# Patient Record
Sex: Male | Born: 1935 | Race: White | Hispanic: No | Marital: Married | Smoking: Former smoker
Health system: Southern US, Community
[De-identification: ages and names within clinical notes are randomized; demographics above are authoritative.]

## PROBLEM LIST (undated history)

## (undated) DIAGNOSIS — K649 Unspecified hemorrhoids: Secondary | ICD-10-CM

## (undated) DIAGNOSIS — L570 Actinic keratosis: Secondary | ICD-10-CM

## (undated) DIAGNOSIS — C679 Malignant neoplasm of bladder, unspecified: Secondary | ICD-10-CM

## (undated) DIAGNOSIS — I10 Essential (primary) hypertension: Secondary | ICD-10-CM

## (undated) HISTORY — DX: Essential (primary) hypertension: I10

## (undated) HISTORY — DX: Malignant neoplasm of bladder, unspecified: C67.9

## (undated) HISTORY — DX: Actinic keratosis: L57.0

## (undated) HISTORY — PX: APPENDECTOMY: SHX54

## (undated) HISTORY — DX: Unspecified hemorrhoids: K64.9

## (undated) HISTORY — PX: INGUINAL HERNIA REPAIR: SHX194

## (undated) HISTORY — PX: COLONOSCOPY: SHX174

---

## 1998-02-14 ENCOUNTER — Other Ambulatory Visit: Admission: RE | Admit: 1998-02-14 | Discharge: 1998-02-14 | Payer: Self-pay | Admitting: Urology

## 1998-08-07 ENCOUNTER — Ambulatory Visit (HOSPITAL_COMMUNITY): Admission: RE | Admit: 1998-08-07 | Discharge: 1998-08-07 | Payer: Self-pay | Admitting: Urology

## 1999-04-13 ENCOUNTER — Other Ambulatory Visit: Admission: RE | Admit: 1999-04-13 | Discharge: 1999-04-13 | Payer: Self-pay | Admitting: Urology

## 2005-09-09 ENCOUNTER — Ambulatory Visit: Payer: Self-pay | Admitting: Family Medicine

## 2009-11-24 ENCOUNTER — Ambulatory Visit (HOSPITAL_COMMUNITY): Admission: RE | Admit: 2009-11-24 | Discharge: 2009-11-25 | Payer: Self-pay | Admitting: General Surgery

## 2010-11-15 ENCOUNTER — Encounter: Payer: Self-pay | Admitting: General Surgery

## 2011-01-11 LAB — CBC
HCT: 41.7 % (ref 39.0–52.0)
Hemoglobin: 13.5 g/dL (ref 13.0–17.0)
MCV: 91.7 fL (ref 78.0–100.0)
Platelets: 163 10*3/uL (ref 150–400)
RDW: 13.4 % (ref 11.5–15.5)

## 2011-01-11 LAB — DIFFERENTIAL
Basophils Absolute: 0 10*3/uL (ref 0.0–0.1)
Eosinophils Relative: 8 % — ABNORMAL HIGH (ref 0–5)
Lymphocytes Relative: 34 % (ref 12–46)
Lymphs Abs: 1.3 10*3/uL (ref 0.7–4.0)
Neutro Abs: 1.6 10*3/uL — ABNORMAL LOW (ref 1.7–7.7)
Neutrophils Relative %: 43 % (ref 43–77)

## 2011-01-11 LAB — BASIC METABOLIC PANEL
BUN: 14 mg/dL (ref 6–23)
GFR calc Af Amer: >60 mL/min (ref >60)
GFR calc non Af Amer: >60 mL/min (ref >60)
Potassium: 4.1 mEq/L (ref 3.5–5.1)
Sodium: 142 mEq/L (ref 135–145)

## 2017-01-08 ENCOUNTER — Emergency Department
Admission: EM | Admit: 2017-01-08 | Discharge: 2017-01-08 | Disposition: A | Discharge status: Home / Self Care | Payer: Medicare Other | Source: Home / Self Care | Attending: Family Medicine | Admitting: Family Medicine

## 2017-01-08 ENCOUNTER — Encounter: Payer: Self-pay | Admitting: Emergency Medicine

## 2017-01-08 DIAGNOSIS — R197 Diarrhea, unspecified: Secondary | ICD-10-CM

## 2017-01-08 DIAGNOSIS — R112 Nausea with vomiting, unspecified: Secondary | ICD-10-CM | POA: Diagnosis not present

## 2017-01-08 MED ORDER — ONDANSETRON 4 MG PO TBDP: ORAL_TABLET | ORAL | 0 refills | Status: DC

## 2017-01-08 NOTE — ED Provider Notes (Signed)
[facility]    CSN: [id number] Arrival date & time: 01/08/17  1804     History   Chief Complaint Chief Complaint  Patient presents with  . Diarrhea  . Emesis    HPI Phillip Monroe is a 82 y.o. male.   Patient awoke at 3am today with watery diarrhea, and has had about 12 to 15 episodes since then.  He had one episode of nausea/vomiting.  He has had abdominal cramps but not abdominal pain.  No fevers, chills, and sweats. Denies recent foreign travel, or drinking untreated water in a wilderness environment.  He denies recent antibiotic use.    The history is provided by the patient.  Diarrhea  Quality:  Watery Severity:  Moderate Onset quality:  Sudden Number of episodes:  12 to 15 Duration:  15 hours Timing:  Sporadic Progression:  Improving Relieved by:  Nothing Exacerbated by: eating. Ineffective treatments: BRAT diet. Associated symptoms: vomiting   Associated symptoms: no abdominal pain, no arthralgias, no chills, no recent cough, no diaphoresis, no fever, no headaches, no myalgias and no URI   Risk factors: no recent antibiotic use, no sick contacts, no suspicious food intake and no travel to endemic areas   Emesis  Associated symptoms: diarrhea   Associated symptoms: no abdominal pain, no arthralgias, no chills, no fever, no headaches, no myalgias and no URI     Past Medical History:  Diagnosis Date  . Actinic keratoses   . Bladder cancer ([place])   . Hemorrhoids   . Hypertension     There are no active problems to display for this patient.   Past Surgical History:  Procedure Laterality Date  . APPENDECTOMY    . COLONOSCOPY    . INGUINAL HERNIA REPAIR         Home Medications    Prior to Admission medications   Medication Sig Start Date End Date Taking? Authorizing Provider  aspirin 81 MG tablet Take 81 mg by mouth daily.    Historical Provider, MD  atenolol (TENORMIN) 100 MG tablet Take 100 mg by mouth daily.    Historical Provider, MD   Fish Oil-Cholecalciferol (FISH OIL + D3) 1000-1000 MG-UNIT CAPS Take by mouth.    Historical Provider, MD  hydrocortisone (ANUSOL-HC) 2.5 % rectal cream Place 1 application rectally 2 (two) times daily.    Historical Provider, MD  omeprazole (PRILOSEC) 20 MG capsule Take 20 mg by mouth daily.    Historical Provider, MD  ondansetron (ZOFRAN ODT) 4 MG disintegrating tablet Take one tab by mouth Q6hr prn nausea.  Dissolve under tongue. 01/08/17   Kandra Nicolas, MD  vitamin E 100 UNIT capsule Take by mouth daily.    Historical Provider, MD    Family History Family History  Problem Relation Age of Onset  . Stroke Mother   . Heart attack Father     Social History Social History  Substance Use Topics  . Smoking status: Never Smoker  . Smokeless tobacco: Not on file  . Alcohol use Not on file     Allergies   Nsaids   Review of Systems Review of Systems  Constitutional: Negative for chills, diaphoresis and fever.  Gastrointestinal: Positive for diarrhea and vomiting. Negative for abdominal pain.  Musculoskeletal: Negative for arthralgias and myalgias.  Neurological: Negative for headaches.     Physical Exam Triage Vital Signs ED Triage Vitals  Enc Vitals Group     BP 01/08/17 1816 134/80     Pulse Rate 01/08/17  1816 91     Resp 01/08/17 1816 16     Temp 01/08/17 1816 97.9 F (36.6 C)     Temp Source 01/08/17 1816 Oral     SpO2 01/08/17 1816 96 %     Weight 01/08/17 1817 164 lb (74.4 kg)     Height 01/08/17 1817 5\' 7"  (1.702 m)     Head Circumference --      Peak Flow --      Pain Score 01/08/17 1819 0     Pain Loc --      Pain Edu? --      Excl. in [place]? --    No data found.   Updated Vital Signs BP 134/80 (BP Location: Left Arm)   Pulse 91   Temp 97.9 F (36.6 C) (Oral)   Resp 16   Ht 5\' 7"  (1.702 m)   Wt 164 lb (74.4 kg)   SpO2 96%   BMI 25.69 kg/m   Visual Acuity Right Eye Distance:   Left Eye Distance:   Bilateral Distance:    Right Eye Near:     Left Eye Near:    Bilateral Near:     Physical Exam Nursing notes and Vital Signs reviewed. Appearance:  Patient appears stated age, and in no acute distress.    Eyes:  Pupils are equal, round, and reactive to light and accomodation.  Extraocular movement is intact.  Conjunctivae are not inflamed   Pharynx:  Normal; moist mucous membranes  Neck:  Supple.  No adenopathy Lungs:  Clear to auscultation.  Breath sounds are equal.  Moving air well. Heart:  Regular rate and rhythm without murmurs, rubs, or gallops.  Abdomen:  Nontender without masses or hepatosplenomegaly.  Bowel sounds are present.  No CVA or flank tenderness.  Extremities:  No edema.  Skin:  No rash present.     UC Treatments / Results  Labs (all labs ordered are listed, but only abnormal results are displayed) Labs Reviewed - No data to display  EKG  EKG Interpretation None       Radiology No results found.  Procedures Procedures (including critical care time)  Medications Ordered in UC Medications - No data to display   Initial Impression / Assessment and Plan / UC Course  I have reviewed the triage vital signs and the nursing notes.  Pertinent labs & imaging results that were available during my care of the patient were reviewed by me and considered in my medical decision making (see chart for details).    Suspect viral gastroenteritis. Rx for Zofran. Begin clear liquids (Pedialyte while having diarrhea) until improved, then advance to a [employer] (Bananas, Rice, Applesauce, Toast).  Then gradually resume a regular diet when tolerated.  Avoid milk products until well.  To decrease diarrhea, mix one teaspoon Citrucel (methylcellulose) in 2 oz water and drink one to three times daily.  Do not drink extra fluids with this dose and do not drink fluids for one hour afterwards.  When stools become more formed, may take Imodium (loperamide) once or twice daily to decrease stool frequency.  If symptoms become  significantly worse during the night or over the weekend, proceed to the local emergency room.  Followup with Family Doctor if not improved in 4 to 5 days.    Final Clinical Impressions(s) / UC Diagnoses   Final diagnoses:  Nausea vomiting and diarrhea    New Prescriptions New Prescriptions   ONDANSETRON (ZOFRAN ODT) 4 MG DISINTEGRATING TABLET  Take one tab by mouth Q6hr prn nausea.  Dissolve under tongue.     Kandra Nicolas, MD 01/13/17 2259

## 2017-01-08 NOTE — Discharge Instructions (Signed)
Begin clear liquids (Pedialyte while having diarrhea) until improved, then advance to a [employer] (Bananas, Rice, Applesauce, Toast).  Then gradually resume a regular diet when tolerated.  Avoid milk products until well.  To decrease diarrhea, mix one teaspoon Citrucel (methylcellulose) in 2 oz water and drink one to three times daily.  Do not drink extra fluids with this dose and do not drink fluids for one hour afterwards.  When stools become more formed, may take Imodium (loperamide) once or twice daily to decrease stool frequency.  If symptoms become significantly worse during the night or over the weekend, proceed to the local emergency room.

## 2017-01-08 NOTE — ED Triage Notes (Signed)
Patient reports awakening at 3:00am today with diarrhea and has had 12-15-episodes since then; he placed himself on BRAT diet but vomited after first solid food; no vomiting since then.

## 2017-02-18 ENCOUNTER — Emergency Department
Admission: EM | Admit: 2017-02-18 | Discharge: 2017-02-18 | Disposition: A | Discharge status: Home / Self Care | Payer: Medicare Other | Source: Home / Self Care | Attending: Family Medicine | Admitting: Family Medicine

## 2017-02-18 ENCOUNTER — Encounter: Payer: Self-pay | Admitting: *Deleted

## 2017-02-18 DIAGNOSIS — J069 Acute upper respiratory infection, unspecified: Secondary | ICD-10-CM

## 2017-02-18 DIAGNOSIS — B9789 Other viral agents as the cause of diseases classified elsewhere: Secondary | ICD-10-CM | POA: Diagnosis not present

## 2017-02-18 MED ORDER — PREDNISONE 20 MG PO TABS: ORAL_TABLET | ORAL | 0 refills | Status: DC

## 2017-02-18 MED ORDER — AZITHROMYCIN 250 MG PO TABS: ORAL_TABLET | ORAL | 0 refills | Status: DC

## 2017-02-18 MED ORDER — BENZONATATE 200 MG PO CAPS: ORAL_CAPSULE | ORAL | 0 refills | Status: DC

## 2017-02-18 NOTE — ED Triage Notes (Signed)
Patient c/o 1 week of cough and congestion. Wheezing on exhalation at times. Afebrile.

## 2017-02-18 NOTE — Discharge Instructions (Signed)
Take plain guaifenesin (1200mg  extended release tabs such as Mucinex) twice daily, with plenty of water, for cough and congestion.  Get adequate rest.   May use Afrin nasal spray (or generic oxymetazoline) each morning for about 5 days and then discontinue.  Also recommend using saline nasal spray several times daily and saline nasal irrigation (AYR is a common brand).   Try warm salt water gargles for sore throat.  Stop all antihistamines for now, and other non-prescription cough/cold preparations.   Begin Azithromycin if not improving about 5 days or if persistent fever develops

## 2017-02-18 NOTE — ED Provider Notes (Signed)
[name]    CSN: [id number] Arrival date & time: 02/18/17  1148     History   Chief Complaint Chief Complaint  Patient presents with  . Cough    HPI Phillip Monroe is a 82 y.o. male.    Patient complains of onset of nasal congestion about a week ago, followed by a non-productive cough 2 to 3 days later.  No sore throat or fever.  During the past 4 days he has had occasional wheezing but no shortness of breath.   The history is provided by the patient.    Past Medical History:  Diagnosis Date  . Actinic keratoses   . Bladder cancer ([place])   . Hemorrhoids   . Hypertension     There are no active problems to display for this patient.   Past Surgical History:  Procedure Laterality Date  . APPENDECTOMY    . COLONOSCOPY    . INGUINAL HERNIA REPAIR         Home Medications    Prior to Admission medications   Medication Sig Start Date End Date Taking? Authorizing Provider  aspirin 81 MG tablet Take 81 mg by mouth daily.    Historical Provider, MD  atenolol (TENORMIN) 100 MG tablet Take 100 mg by mouth daily.    Historical Provider, MD  azithromycin (ZITHROMAX Z-PAK) 250 MG tablet Take 2 tabs today; then begin one tab once daily for 4 more days. (Rx void after 02/26/17) 02/18/17   Kandra Nicolas, MD  benzonatate (TESSALON) 200 MG capsule Take one cap by mouth at bedtime as needed for cough.  May repeat in 4 to 6 hours 02/18/17   Kandra Nicolas, MD  Fish Oil-Cholecalciferol (FISH OIL + D3) 1000-1000 MG-UNIT CAPS Take by mouth.    Historical Provider, MD  hydrocortisone (ANUSOL-HC) 2.5 % rectal cream Place 1 application rectally 2 (two) times daily.    Historical Provider, MD  omeprazole (PRILOSEC) 20 MG capsule Take 20 mg by mouth daily.    Historical Provider, MD  predniSONE (DELTASONE) 20 MG tablet Take one tab by mouth twice daily for 4 days, then one daily for 3 days. Take with food. 02/18/17   Kandra Nicolas, MD  vitamin E 100 UNIT capsule Take by mouth  daily.    Historical Provider, MD    Family History Family History  Problem Relation Age of Onset  . Stroke Mother   . Heart attack Father     Social History Social History  Substance Use Topics  . Smoking status: Former Research scientist (life sciences)  . Smokeless tobacco: Never Used  . Alcohol use Not on file     Allergies   Nsaids   Review of Systems Review of Systems No sore throat + cough No pleuritic pain + wheezing + nasal congestion + post-nasal drainage No sinus pain/pressure No itchy/red eyes No earache No hemoptysis No SOB No fever/chills No nausea No vomiting No abdominal pain No diarrhea No urinary symptoms No skin rash + fatigue No myalgias No headache Used OTC meds without relief   Physical Exam Triage Vital Signs ED Triage Vitals [02/18/17 1205]  Enc Vitals Group     BP (!) 147/82     Pulse Rate 64     Resp 14     Temp 98.5 F (36.9 C)     Temp Source Oral     SpO2 97 %     Weight 164 lb (74.4 kg)     Height  Head Circumference      Peak Flow      Pain Score 0     Pain Loc      Pain Edu?      Excl. in [place]?    No data found.   Updated Vital Signs BP (!) 147/82 (BP Location: Left Arm)   Pulse 64   Temp 98.5 F (36.9 C) (Oral)   Resp 14   Wt 164 lb (74.4 kg)   SpO2 97%   BMI 25.69 kg/m   Visual Acuity Right Eye Distance:   Left Eye Distance:   Bilateral Distance:    Right Eye Near:   Left Eye Near:    Bilateral Near:     Physical Exam Nursing notes and Vital Signs reviewed. Appearance:  Patient appears stated age, and in no acute distress Eyes:  Pupils are equal, round, and reactive to light and accomodation.  Extraocular movement is intact.  Conjunctivae are not inflamed  Ears:  Canals normal.  Tympanic membranes normal.  Nose:  Mildly congested turbinates.  No sinus tenderness.   Pharynx:  Normal Neck:  Supple.  Tender enlarged posterior/lateral nodes are palpated bilaterally  Lungs:  Faint wheezes heard posteriorly.  Breath  sounds are equal.  Moving air well. Heart:  Regular rate and rhythm without murmurs, rubs, or gallops.  Abdomen:  Nontender without masses or hepatosplenomegaly.  Bowel sounds are present.  No CVA or flank tenderness.  Extremities:  No edema.  Skin:  No rash present.    UC Treatments / Results  Labs (all labs ordered are listed, but only abnormal results are displayed) Labs Reviewed - No data to display  EKG  EKG Interpretation None       Radiology No results found.  Procedures Procedures (including critical care time)  Medications Ordered in UC Medications - No data to display   Initial Impression / Assessment and Plan / UC Course  I have reviewed the triage vital signs and the nursing notes.  Pertinent labs & imaging results that were available during my care of the patient were reviewed by me and considered in my medical decision making (see chart for details).    There is no evidence of bacterial infection today.   Begin prednisone burst/taper.  Prescription written for Benzonatate [facility]) to take at bedtime for night-time cough.  Take plain guaifenesin (1200mg  extended release tabs such as Mucinex) twice daily, with plenty of water, for cough and congestion.  Get adequate rest.   May use Afrin nasal spray (or generic oxymetazoline) each morning for about 5 days and then discontinue.  Also recommend using saline nasal spray several times daily and saline nasal irrigation (AYR is a common brand).   Try warm salt water gargles for sore throat.  Stop all antihistamines for now, and other non-prescription cough/cold preparations.   Begin Azithromycin if not improving about 5 days or if persistent fever develops (Given a prescription to hold, with an expiration date)     Final Clinical Impressions(s) / UC Diagnoses   Final diagnoses:  Viral URI with cough    New Prescriptions New Prescriptions   AZITHROMYCIN (ZITHROMAX Z-PAK) 250 MG TABLET    Take 2 tabs today;  then begin one tab once daily for 4 more days. (Rx void after 02/26/17)   BENZONATATE (TESSALON) 200 MG CAPSULE    Take one cap by mouth at bedtime as needed for cough.  May repeat in 4 to 6 hours   PREDNISONE (DELTASONE) 20 MG  TABLET    Take one tab by mouth twice daily for 4 days, then one daily for 3 days. Take with food.     Kandra Nicolas, MD 02/27/17 1005

## 2017-11-18 ENCOUNTER — Other Ambulatory Visit: Payer: Self-pay

## 2017-11-18 ENCOUNTER — Encounter: Payer: Self-pay | Admitting: *Deleted

## 2017-11-18 ENCOUNTER — Emergency Department
Admission: EM | Admit: 2017-11-18 | Discharge: 2017-11-18 | Disposition: A | Discharge status: Home / Self Care | Payer: Medicare Other | Source: Home / Self Care | Attending: Family Medicine | Admitting: Family Medicine

## 2017-11-18 DIAGNOSIS — J069 Acute upper respiratory infection, unspecified: Secondary | ICD-10-CM | POA: Diagnosis not present

## 2017-11-18 NOTE — ED Triage Notes (Signed)
Pt c/o fever up to 102.5, chills, runny nose, and  nonproductive cough x 2 days. He has taken Mucinex plain and DM.

## 2017-11-18 NOTE — ED Provider Notes (Signed)
[name]    CSN: [id number] Arrival date & time: 11/18/17  1617     History   Chief Complaint Chief Complaint  Patient presents with  . Fever    HPI Phillip Monroe is a 83 y.o. male.   HPI  Phillip Monroe is a 83 y.o. male presenting to UC with c/o 2 days of non-productive cough, chills, rhinorrhea and fever Tmax 102.5*F that started the first day of symptoms.  No fever since then.  Pt has been taking Mucinex and mucinex DM with moderate relief. Pt states he has felt a little better each day.  Denies n/v/d. His wife is also in UC to be seen for similar symptoms that started yesterday.  He did not get the flu vaccine but has had the flu in the past and states this does not feel like the flu because he felt much worse then. He denies body aches or HA.   Past Medical History:  Diagnosis Date  . Actinic keratoses   . Bladder cancer ([place])   . Hemorrhoids   . Hypertension     There are no active problems to display for this patient.   Past Surgical History:  Procedure Laterality Date  . APPENDECTOMY    . COLONOSCOPY    . INGUINAL HERNIA REPAIR         Home Medications    Prior to Admission medications   Medication Sig Start Date End Date Taking? Authorizing Provider  aspirin 81 MG tablet Take 81 mg by mouth daily.    [provider]  atenolol (TENORMIN) 100 MG tablet Take 100 mg by mouth daily.    [provider]  Fish Oil-Cholecalciferol (FISH OIL + D3) 1000-1000 MG-UNIT CAPS Take by mouth.    [provider]  hydrocortisone (ANUSOL-HC) 2.5 % rectal cream Place 1 application rectally 2 (two) times daily.    [provider]  omeprazole (PRILOSEC) 20 MG capsule Take 20 mg by mouth daily.    [provider]  vitamin E 100 UNIT capsule Take by mouth daily.    [provider]    Family History Family History  Problem Relation Age of Onset  . Stroke Mother   . Heart attack Father     Social  History Social History   Tobacco Use  . Smoking status: Former Research scientist (life sciences)  . Smokeless tobacco: Never Used  Substance Use Topics  . Alcohol use: No    Frequency: Never  . Drug use: No     Allergies   Nsaids   Review of Systems Review of Systems  Constitutional: Positive for chills and fever.  HENT: Positive for congestion and rhinorrhea. Negative for ear pain, sore throat, trouble swallowing and voice change.   Respiratory: Positive for cough. Negative for shortness of breath.   Cardiovascular: Negative for chest pain and palpitations.  Gastrointestinal: Negative for abdominal pain, diarrhea, nausea and vomiting.  Musculoskeletal: Negative for arthralgias, back pain and myalgias.  Skin: Negative for rash.  Neurological: Negative for dizziness, light-headedness and headaches.     Physical Exam Triage Vital Signs ED Triage Vitals [11/18/17 1645]  Enc Vitals Group     BP (!) 150/76     Pulse Rate (!) 54     Resp 16     Temp 98 F (36.7 C)     Temp Source Oral     SpO2 98 %     Weight 163 lb (73.9 kg)     Height 5'  5" (1.651 m)     Head Circumference      Peak Flow      Pain Score 0     Pain Loc      Pain Edu?      Excl. in [place]?    No data found.  Updated Vital Signs BP (!) 150/76 (BP Location: Right Arm)   Pulse (!) 54   Temp 98 F (36.7 C) (Oral)   Resp 16   Ht 5\' 5"  (1.651 m)   Wt 163 lb (73.9 kg)   SpO2 98%   BMI 27.12 kg/m   Visual Acuity Right Eye Distance:   Left Eye Distance:   Bilateral Distance:    Right Eye Near:   Left Eye Near:    Bilateral Near:     Physical Exam  Constitutional: He is oriented to person, place, and time. He appears well-developed and well-nourished. No distress.  Pt sitting on exam bed, appears well, NAD  HENT:  Head: Normocephalic and atraumatic.  Right Ear: Tympanic membrane normal.  Left Ear: Tympanic membrane normal.  Nose: Nose normal. Right sinus exhibits no maxillary sinus tenderness and no frontal sinus  tenderness. Left sinus exhibits no maxillary sinus tenderness and no frontal sinus tenderness.  Mouth/Throat: Uvula is midline, oropharynx is clear and moist and mucous membranes are normal.  Eyes: EOM are normal.  Neck: Normal range of motion. Neck supple.  Cardiovascular: Regular rhythm. Bradycardia present.  Slight bradycardia. Regular rhythm  Pulmonary/Chest: Effort normal and breath sounds normal. No stridor. No respiratory distress. He has no wheezes. He has no rales.  Musculoskeletal: Normal range of motion.  Lymphadenopathy:    He has no cervical adenopathy.  Neurological: He is alert and oriented to person, place, and time.  Skin: Skin is warm and dry. He is not diaphoretic.  Psychiatric: He has a normal mood and affect. His behavior is normal.  Nursing note and vitals reviewed.    UC Treatments / Results  Labs (all labs ordered are listed, but only abnormal results are displayed) Labs Reviewed - No data to display  EKG  EKG Interpretation None       Radiology No results found.  Procedures Procedures (including critical care time)  Medications Ordered in UC Medications - No data to display   Initial Impression / Assessment and Plan / UC Course  I have reviewed the triage vital signs and the nursing notes.  Pertinent labs & imaging results that were available during my care of the patient were reviewed by me and considered in my medical decision making (see chart for details).    Hx and exam c/w viral illness No evidence of underlying bacterial infection at this time Reassuring pt is already feeling better each day. Encouraged continued fluids and rest May continue to take tylenol and/or mucinex as needed F/u with PCP in 1 week if not improving, sooner if significantly worsening.    Final Clinical Impressions(s) / UC Diagnoses   Final diagnoses:  Viral upper respiratory illness    ED Discharge Orders    None       Controlled Substance  Prescriptions [place] Controlled Substance Registry consulted? Not Applicable   Tyrell Antonio 11/18/17 8453

## 2019-08-07 ENCOUNTER — Other Ambulatory Visit: Payer: Self-pay | Admitting: Family Medicine

## 2019-08-07 DIAGNOSIS — R634 Abnormal weight loss: Secondary | ICD-10-CM

## 2019-08-08 ENCOUNTER — Other Ambulatory Visit: Payer: Self-pay | Admitting: Family Medicine

## 2019-08-28 ENCOUNTER — Ambulatory Visit
Admission: RE | Admit: 2019-08-28 | Discharge: 2019-08-28 | Disposition: A | Discharge status: Home / Self Care | Payer: Medicare Other | Source: Ambulatory Visit | Attending: Family Medicine | Admitting: Family Medicine

## 2019-08-28 ENCOUNTER — Other Ambulatory Visit: Payer: Self-pay

## 2019-08-28 DIAGNOSIS — R634 Abnormal weight loss: Secondary | ICD-10-CM

## 2019-08-28 MED ORDER — IOPAMIDOL (ISOVUE-300) INJECTION 61%
100.0000 mL | Freq: Once | INTRAVENOUS | Status: AC | PRN
  Administered 2019-08-28: 100 mL via INTRAVENOUS

## 2019-08-30 ENCOUNTER — Other Ambulatory Visit: Payer: Self-pay | Admitting: Family Medicine

## 2019-08-30 DIAGNOSIS — R935 Abnormal findings on diagnostic imaging of other abdominal regions, including retroperitoneum: Secondary | ICD-10-CM

## 2019-09-06 ENCOUNTER — Emergency Department
Admission: EM | Admit: 2019-09-06 | Discharge: 2019-09-06 | Disposition: A | Discharge status: Home / Self Care | Payer: Medicare Other | Source: Home / Self Care | Attending: Family Medicine | Admitting: Family Medicine

## 2019-09-06 ENCOUNTER — Encounter: Payer: Self-pay | Admitting: Emergency Medicine

## 2019-09-06 ENCOUNTER — Other Ambulatory Visit: Payer: Self-pay

## 2019-09-06 DIAGNOSIS — B029 Zoster without complications: Secondary | ICD-10-CM

## 2019-09-06 MED ORDER — PREDNISONE 20 MG PO TABS: ORAL_TABLET | ORAL | 0 refills | Status: DC

## 2019-09-06 MED ORDER — VALACYCLOVIR HCL 1 G PO TABS: 1000.0000 mg | ORAL_TABLET | Freq: Three times a day (TID) | ORAL | 0 refills | Status: DC

## 2019-09-06 NOTE — ED Provider Notes (Signed)
[name]    CSN: [id number] Arrival date & time: 09/06/19  [id number]      History   Chief Complaint Chief Complaint  Patient presents with  . Rash    HPI Phillip Monroe is a 84 y.o. male.   Patient developed soreness in his right anterior thigh, radiating to his right groin, about a week ago.  About 3 days ago he developed a vesicular rash over his right anterior thigh.  He feels well otherwise; no fevers, chills, and sweats.  The history is provided by the patient.  Rash Location: right anterior thigh. Quality: blistering, burning, painful and redness   Quality: not swelling and not weeping   Pain details:    Quality:  Aching and dull   Severity:  Mild   Onset quality:  Sudden   Duration:  1 week   Timing:  Constant   Progression:  Unchanged Severity:  Mild Onset quality:  Sudden Duration:  3 days Timing:  Constant Progression:  Unchanged Chronicity:  New Context: not animal contact, not chemical exposure, not exposure to similar rash, not food, not hot tub use, not insect bite/sting, not medications, not new detergent/soap, not nuts and not plant contact   Relieved by:  Nothing Worsened by:  Contact Ineffective treatments:  Anti-itch cream Associated symptoms: no abdominal pain, no fatigue, no fever, no induration, no joint pain, no myalgias, no nausea and no sore throat     Past Medical History:  Diagnosis Date  . Actinic keratoses   . Bladder cancer ([place])   . Hemorrhoids   . Hypertension     There are no active problems to display for this patient.   Past Surgical History:  Procedure Laterality Date  . APPENDECTOMY    . COLONOSCOPY    . INGUINAL HERNIA REPAIR         Home Medications    Prior to Admission medications   Medication Sig Start Date End Date Taking? Authorizing Provider  atenolol (TENORMIN) 100 MG tablet Take 100 mg by mouth daily.    [provider]  Fish Oil-Cholecalciferol (FISH OIL + D3) 1000-1000 MG-UNIT  CAPS Take by mouth.    [provider]  hydrocortisone (ANUSOL-HC) 2.5 % rectal cream Place 1 application rectally 2 (two) times daily.    [provider]  omeprazole (PRILOSEC) 20 MG capsule Take 20 mg by mouth daily.    [provider]  predniSONE (DELTASONE) 20 MG tablet Take one tab by mouth twice daily for 4 days, then one daily for 3 days. Take with food. 09/06/19   Kandra Nicolas, MD  valACYclovir (VALTREX) 1000 MG tablet Take 1 tablet (1,000 mg total) by mouth 3 (three) times daily. 09/06/19   Kandra Nicolas, MD  vitamin E 100 UNIT capsule Take by mouth daily.    [provider]    Family History Family History  Problem Relation Age of Onset  . Stroke Mother   . Heart attack Father     Social History Social History   Tobacco Use  . Smoking status: Former Research scientist (life sciences)  . Smokeless tobacco: Never Used  Substance Use Topics  . Alcohol use: No    Frequency: Never  . Drug use: No     Allergies   Nsaids   Review of Systems Review of Systems  Constitutional: Negative for fatigue and fever.  HENT: Negative for sore throat.   Gastrointestinal: Negative for abdominal pain and nausea.  Musculoskeletal: Negative for arthralgias and  myalgias.  Skin: Positive for rash.  All other systems reviewed and are negative.    Physical Exam Triage Vital Signs ED Triage Vitals  Enc Vitals Group     BP 09/06/19 0858 (!) 179/91     Pulse Rate 09/06/19 0858 (!) 54     Resp --      Temp 09/06/19 0858 97.7 F (36.5 C)     Temp Source 09/06/19 0858 Oral     SpO2 09/06/19 0858 97 %     Weight 09/06/19 0859 145 lb (65.8 kg)     Height 09/06/19 0859 5\' 5"  (1.651 m)     Head Circumference --      Peak Flow --      Pain Score 09/06/19 0859 5     Pain Loc --      Pain Edu? --      Excl. in [place]? --    No data found.  Updated Vital Signs BP (!) 179/91 (BP Location: Right Arm)   Pulse (!) 54   Temp 97.7 F (36.5 C) (Oral)   Ht 5\' 5"  (1.651 m)    Wt 65.8 kg   SpO2 97%   BMI 24.13 kg/m   Visual Acuity Right Eye Distance:   Left Eye Distance:   Bilateral Distance:    Right Eye Near:   Left Eye Near:    Bilateral Near:     Physical Exam Vitals signs and nursing note reviewed.  Constitutional:      General: He is not in acute distress. HENT:     Head: Normocephalic.     Nose: Nose normal.     Mouth/Throat:     Mouth: Mucous membranes are moist.  Eyes:     Pupils: Pupils are equal, round, and reactive to light.  Cardiovascular:     Rate and Rhythm: Normal rate.  Pulmonary:     Effort: Pulmonary effort is normal.  Musculoskeletal:     Right lower leg: No edema.     Left lower leg: No edema.  Skin:    General: Skin is warm and dry.          Comments: On the right anterior thigh there are scattered erythematous herpetic lesions extending to right groin as noted on diagram.  No induration, warmth, or fluctuance.  Minimal tenderness to palpation   Neurological:     Mental Status: He is alert.      UC Treatments / Results  Labs (all labs ordered are listed, but only abnormal results are displayed) Labs Reviewed - No data to display  EKG   Radiology No results found.  Procedures Procedures (including critical care time)  Medications Ordered in UC Medications - No data to display  Initial Impression / Assessment and Plan / UC Course  I have reviewed the triage vital signs and the nursing notes.  Pertinent labs & imaging results that were available during my care of the patient were reviewed by me and considered in my medical decision making (see chart for details).    Begin Valtrex and prednisone burst/taper. Followup with Family Doctor if not improved in one week.   Final Clinical Impressions(s) / UC Diagnoses   Final diagnoses:  Herpes zoster without complication     Discharge Instructions     May take Tylenol if needed for pain    ED Prescriptions    Medication Sig Dispense Auth.  Provider   valACYclovir (VALTREX) 1000 MG tablet Take 1 tablet (1,000 mg total) by  mouth 3 (three) times daily. 21 tablet Kandra Nicolas, MD   predniSONE (DELTASONE) 20 MG tablet Take one tab by mouth twice daily for 4 days, then one daily for 3 days. Take with food. 11 tablet Kandra Nicolas, MD        Kandra Nicolas, MD 09/06/19 [phone]

## 2019-09-06 NOTE — Discharge Instructions (Addendum)
May take Tylenol if needed for pain

## 2019-09-06 NOTE — ED Triage Notes (Signed)
Rash on RT leg x 3 days, goes up into groin, blisters that burn.

## 2019-09-10 ENCOUNTER — Other Ambulatory Visit: Payer: Self-pay

## 2019-09-10 ENCOUNTER — Ambulatory Visit: Payer: Medicare Other

## 2019-09-10 DIAGNOSIS — R935 Abnormal findings on diagnostic imaging of other abdominal regions, including retroperitoneum: Secondary | ICD-10-CM

## 2019-09-10 MED ORDER — GADOBUTROL 1 MMOL/ML IV SOLN
6.5000 mL | Freq: Once | INTRAVENOUS | Status: AC | PRN
  Administered 2019-09-10: 15:00:00 6.5 mL via INTRAVENOUS

## 2019-09-18 ENCOUNTER — Other Ambulatory Visit: Payer: Self-pay

## 2019-09-18 ENCOUNTER — Encounter: Payer: Self-pay | Admitting: Emergency Medicine

## 2019-09-18 ENCOUNTER — Emergency Department
Admission: EM | Admit: 2019-09-18 | Discharge: 2019-09-18 | Disposition: A | Discharge status: Home / Self Care | Payer: Medicare Other | Source: Home / Self Care

## 2019-09-18 DIAGNOSIS — B028 Zoster with other complications: Secondary | ICD-10-CM

## 2019-09-18 MED ORDER — CEPHALEXIN 500 MG PO CAPS: 500.0000 mg | ORAL_CAPSULE | Freq: Four times a day (QID) | ORAL | 0 refills | Status: AC

## 2019-09-18 NOTE — Discharge Instructions (Signed)
Return if any problems.

## 2019-09-18 NOTE — ED Triage Notes (Signed)
Patient has been diagnosed with shingles on right leg upper area into groing; for past 2 days he has suspected a couple areas have become infected.

## 2019-09-18 NOTE — ED Provider Notes (Signed)
[facility]    CSN: [id number] Arrival date & time: 09/18/19  [id number]      History   Chief Complaint Chief Complaint  Patient presents with  . Herpes Zoster    right leg    HPI Phillip Monroe is a 84 y.o. male.   The history is provided by the patient. No language interpreter was used.  Rash Location:  Leg Leg rash location:  R upper leg Quality: blistering, painful and swelling   Pain details:    Severity:  Moderate   Progression:  Worsening Severity:  Moderate Onset quality:  Gradual Timing:  Constant Progression:  Worsening Worsened by:  Nothing Ineffective treatments:  None tried Associated symptoms: no fever    Pt reports he has had shingles on his right inner leg.  Pt reports some areas are drying up.  Pt reports he thinks he has infection to 2 areas. Past Medical History:  Diagnosis Date  . Actinic keratoses   . Bladder cancer ([place])   . Hemorrhoids   . Hypertension     There are no active problems to display for this patient.   Past Surgical History:  Procedure Laterality Date  . APPENDECTOMY    . COLONOSCOPY    . INGUINAL HERNIA REPAIR         Home Medications    Prior to Admission medications   Medication Sig Start Date End Date Taking? Authorizing Provider  atenolol (TENORMIN) 100 MG tablet Take 100 mg by mouth daily.    [provider]  cephALEXin (KEFLEX) 500 MG capsule Take 1 capsule (500 mg total) by mouth 4 (four) times daily for 7 days. 09/18/19 09/25/19  Fransico Meadow, PA-C  Fish Oil-Cholecalciferol (FISH OIL + D3) 1000-1000 MG-UNIT CAPS Take by mouth.    [provider]  hydrocortisone (ANUSOL-HC) 2.5 % rectal cream Place 1 application rectally 2 (two) times daily.    [provider]  omeprazole (PRILOSEC) 20 MG capsule Take 20 mg by mouth daily.    [provider]  vitamin E 100 UNIT capsule Take by mouth daily.    [provider]    Family History Family History  Problem  Relation Age of Onset  . Stroke Mother   . Heart attack Father     Social History Social History   Tobacco Use  . Smoking status: Former Research scientist (life sciences)  . Smokeless tobacco: Never Used  Substance Use Topics  . Alcohol use: No    Frequency: Never  . Drug use: No     Allergies   Nsaids   Review of Systems Review of Systems  Constitutional: Negative for fever.  Skin: Positive for rash.  All other systems reviewed and are negative.    Physical Exam Triage Vital Signs ED Triage Vitals  Enc Vitals Group     BP 09/18/19 0831 (!) 174/96     Pulse Rate 09/18/19 0831 62     Resp 09/18/19 0831 16     Temp 09/18/19 0831 98 F (36.7 C)     Temp Source 09/18/19 0831 Oral     SpO2 09/18/19 0831 99 %     Weight 09/18/19 0832 145 lb (65.8 kg)     Height 09/18/19 0832 5\' 6"  (1.676 m)     Head Circumference --      Peak Flow --      Pain Score 09/18/19 0832 2     Pain Loc --      Pain Edu? --  Excl. in [place]? --    No data found.  Updated Vital Signs BP (!) 174/96 (BP Location: Right Arm)   Pulse 62   Temp 98 F (36.7 C) (Oral)   Resp 16   Ht 5\' 6"  (1.676 m)   Wt 65.8 kg   SpO2 99%   BMI 23.40 kg/m   Visual Acuity Right Eye Distance:   Left Eye Distance:   Bilateral Distance:    Right Eye Near:   Left Eye Near:    Bilateral Near:     Physical Exam Vitals signs and nursing note reviewed.  Constitutional:      Appearance: He is well-developed.  HENT:     Head: Normocephalic and atraumatic.  Eyes:     Conjunctiva/sclera: Conjunctivae normal.  Neck:     Musculoskeletal: Neck supple.  Cardiovascular:     Rate and Rhythm: Normal rate and regular rhythm.     Heart sounds: No murmur.  Pulmonary:     Effort: Pulmonary effort is normal. No respiratory distress.     Breath sounds: Normal breath sounds.  Abdominal:     Tenderness: There is no abdominal tenderness.  Musculoskeletal:        General: Swelling present.     Comments: Erythema around dried areas,  small amount of drainage   Skin:    General: Skin is warm and dry.  Neurological:     Mental Status: He is alert.      UC Treatments / Results  Labs (all labs ordered are listed, but only abnormal results are displayed) Labs Reviewed - No data to display  EKG   Radiology No results found.  Procedures Procedures (including critical care time)  Medications Ordered in UC Medications - No data to display  Initial Impression / Assessment and Plan / UC Course  I have reviewed the triage vital signs and the nursing notes.  Pertinent labs & imaging results that were available during my care of the patient were reviewed by me and considered in my medical decision making (see chart for details).     MDM   Pt counseled on possible skin infection.   Final Clinical Impressions(s) / UC Diagnoses   Final diagnoses:  Herpes zoster with other complication     Discharge Instructions     Return if any problems.    ED Prescriptions    Medication Sig Dispense Auth. Provider   cephALEXin (KEFLEX) 500 MG capsule Take 1 capsule (500 mg total) by mouth 4 (four) times daily for 7 days. 28 capsule Fransico Meadow, [place]     PDMP not reviewed this encounter.   An After Visit Summary was printed and given to the patient.    Fransico Meadow, [place] 09/18/19 1341

## 2019-12-24 ENCOUNTER — Other Ambulatory Visit: Payer: Self-pay | Admitting: Physician Assistant

## 2019-12-24 DIAGNOSIS — E041 Nontoxic single thyroid nodule: Secondary | ICD-10-CM

## 2019-12-25 ENCOUNTER — Ambulatory Visit
Admission: RE | Admit: 2019-12-25 | Discharge: 2019-12-25 | Disposition: A | Discharge status: Home / Self Care | Payer: Medicare Other | Source: Ambulatory Visit | Attending: Physician Assistant | Admitting: Physician Assistant

## 2019-12-25 DIAGNOSIS — E041 Nontoxic single thyroid nodule: Secondary | ICD-10-CM

## 2020-09-26 ENCOUNTER — Other Ambulatory Visit: Payer: Self-pay | Admitting: Physician Assistant

## 2020-09-26 DIAGNOSIS — R634 Abnormal weight loss: Secondary | ICD-10-CM

## 2020-10-28 ENCOUNTER — Other Ambulatory Visit: Payer: Self-pay | Admitting: Family Medicine

## 2020-10-28 ENCOUNTER — Ambulatory Visit
Admission: RE | Admit: 2020-10-28 | Discharge: 2020-10-28 | Disposition: A | Discharge status: Home / Self Care | Payer: Medicare Other | Source: Ambulatory Visit | Attending: Physician Assistant | Admitting: Physician Assistant

## 2020-10-28 DIAGNOSIS — R634 Abnormal weight loss: Secondary | ICD-10-CM | POA: Diagnosis not present

## 2020-10-28 DIAGNOSIS — E041 Nontoxic single thyroid nodule: Secondary | ICD-10-CM

## 2020-10-31 DIAGNOSIS — Z1159 Encounter for screening for other viral diseases: Secondary | ICD-10-CM | POA: Diagnosis not present

## 2020-12-05 DIAGNOSIS — I8310 Varicose veins of unspecified lower extremity with inflammation: Secondary | ICD-10-CM | POA: Diagnosis not present

## 2020-12-05 DIAGNOSIS — L720 Epidermal cyst: Secondary | ICD-10-CM | POA: Diagnosis not present

## 2020-12-05 DIAGNOSIS — L218 Other seborrheic dermatitis: Secondary | ICD-10-CM | POA: Diagnosis not present

## 2020-12-05 DIAGNOSIS — L570 Actinic keratosis: Secondary | ICD-10-CM | POA: Diagnosis not present

## 2020-12-08 DIAGNOSIS — Z01812 Encounter for preprocedural laboratory examination: Secondary | ICD-10-CM | POA: Diagnosis not present

## 2020-12-11 DIAGNOSIS — K317 Polyp of stomach and duodenum: Secondary | ICD-10-CM | POA: Diagnosis not present

## 2020-12-11 DIAGNOSIS — K5730 Diverticulosis of large intestine without perforation or abscess without bleeding: Secondary | ICD-10-CM | POA: Diagnosis not present

## 2020-12-11 DIAGNOSIS — R634 Abnormal weight loss: Secondary | ICD-10-CM | POA: Diagnosis not present

## 2020-12-11 DIAGNOSIS — K621 Rectal polyp: Secondary | ICD-10-CM | POA: Diagnosis not present

## 2020-12-11 DIAGNOSIS — K449 Diaphragmatic hernia without obstruction or gangrene: Secondary | ICD-10-CM | POA: Diagnosis not present

## 2020-12-11 DIAGNOSIS — K648 Other hemorrhoids: Secondary | ICD-10-CM | POA: Diagnosis not present

## 2020-12-15 DIAGNOSIS — K317 Polyp of stomach and duodenum: Secondary | ICD-10-CM | POA: Diagnosis not present

## 2020-12-15 DIAGNOSIS — K621 Rectal polyp: Secondary | ICD-10-CM | POA: Diagnosis not present

## 2020-12-23 ENCOUNTER — Ambulatory Visit
Admission: RE | Admit: 2020-12-23 | Discharge: 2020-12-23 | Disposition: A | Discharge status: Home / Self Care | Payer: Medicare Other | Source: Ambulatory Visit | Attending: Family Medicine | Admitting: Family Medicine

## 2020-12-23 DIAGNOSIS — E041 Nontoxic single thyroid nodule: Secondary | ICD-10-CM

## 2021-01-01 DIAGNOSIS — K449 Diaphragmatic hernia without obstruction or gangrene: Secondary | ICD-10-CM | POA: Diagnosis not present

## 2021-01-01 DIAGNOSIS — N281 Cyst of kidney, acquired: Secondary | ICD-10-CM | POA: Diagnosis not present

## 2021-01-01 DIAGNOSIS — Z1159 Encounter for screening for other viral diseases: Secondary | ICD-10-CM | POA: Diagnosis not present

## 2021-01-01 DIAGNOSIS — R634 Abnormal weight loss: Secondary | ICD-10-CM | POA: Diagnosis not present

## 2021-01-01 DIAGNOSIS — K7689 Other specified diseases of liver: Secondary | ICD-10-CM | POA: Diagnosis not present

## 2021-01-01 DIAGNOSIS — I10 Essential (primary) hypertension: Secondary | ICD-10-CM | POA: Diagnosis not present

## 2021-01-01 DIAGNOSIS — M2550 Pain in unspecified joint: Secondary | ICD-10-CM | POA: Diagnosis not present

## 2021-03-19 IMAGING — RF DG UGI W/ HIGH DENSITY W/O KUB
9 of 10 series · 14 of 24 positions shown · non-contrast
Comparison: CT abdomen pelvis 08/28/2019

CLINICAL DATA: Weight loss

EXAM:
UPPER GI SERIES WITH KUB
TECHNIQUE: After obtaining a scout radiograph a routine upper GI series was
performed using thin and high density barium.
FLUOROSCOPY TIME:  Fluoroscopy Time:  1 minutes 18 seconds
Radiation Exposure Index (if provided by the fluoroscopic device):
Number of Acquired Spot Images: 12

[Series 1: one shot · 0.14mm/px · 1 of 1 slices shown]
[im 1/1]
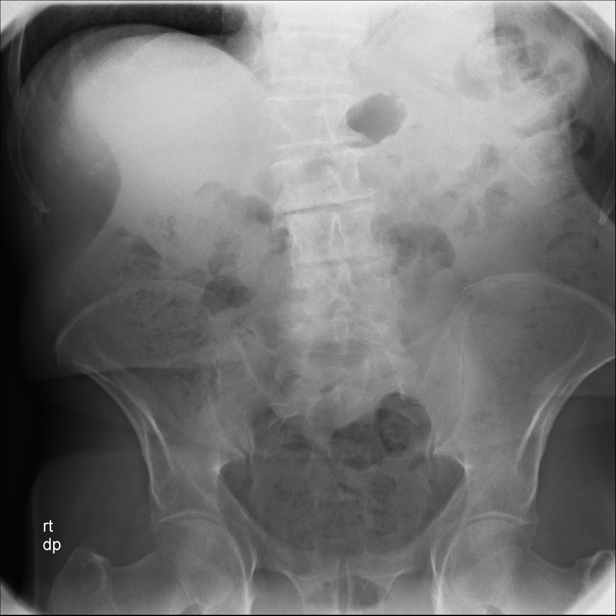

[Series 2: sequence · 0.28mm/px · 1 of 30 frames shown (1 of 8)]
[frame 17/30]
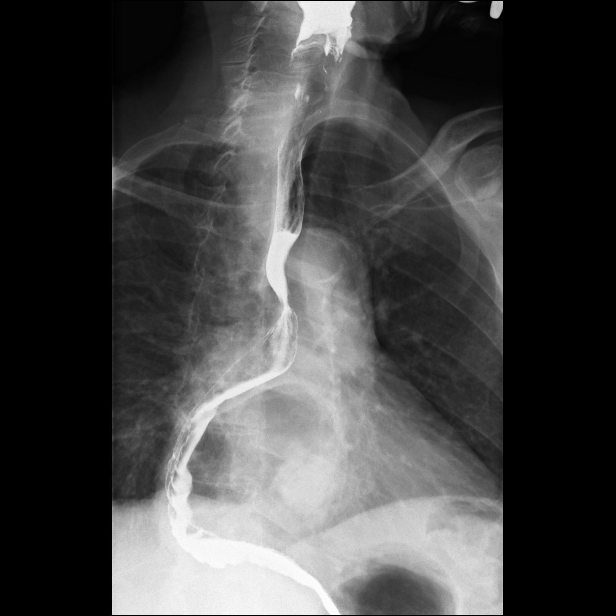

[Series 3: sequence · 0.28mm/px · 2 of 12 frames shown (2 of 8)]
[frame 2/12]
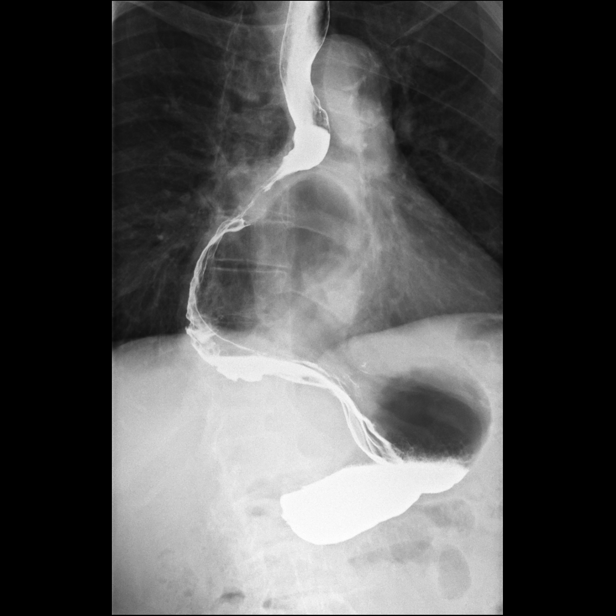
[frame 11/12]
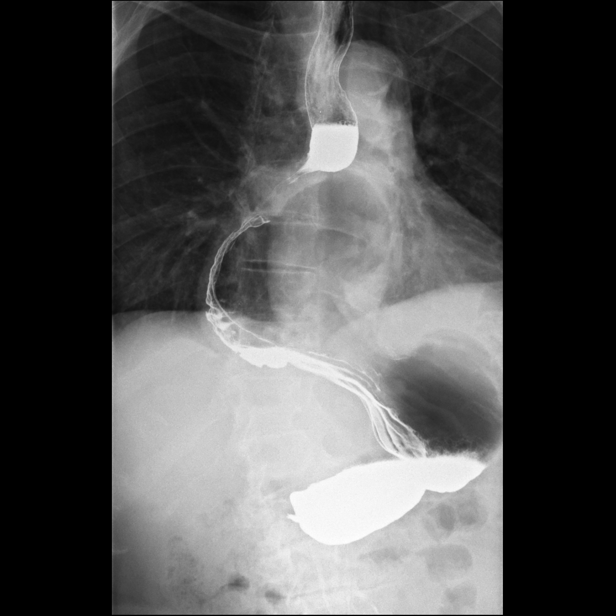

[Series 4: sequence · 0.28mm/px · 1 of 1 slices shown (3 of 8)]
[im 1/1]
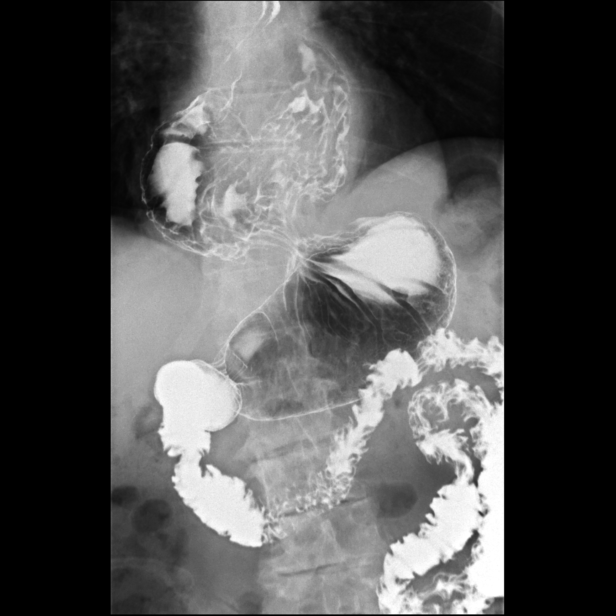

[Series 5: sequence · 0.28mm/px · 1 of 2 frames shown (4 of 8)]
[frame 2/2]
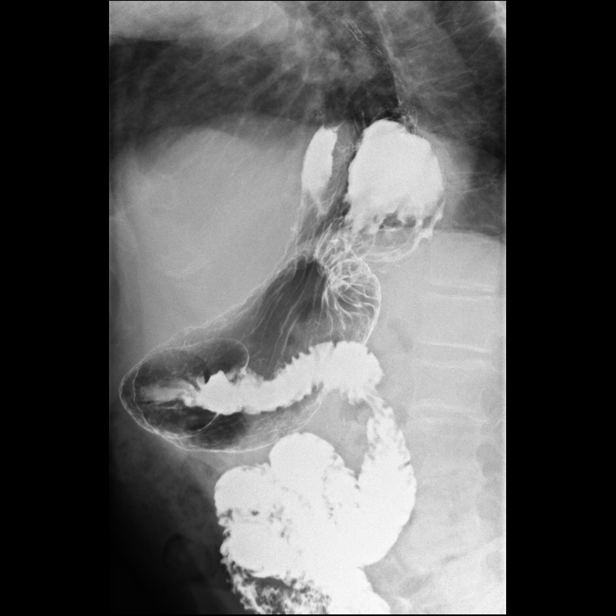

[Series 7: sequence · 0.28mm/px · 2 of 30 frames shown (5 of 8)]
[frame 5/30]
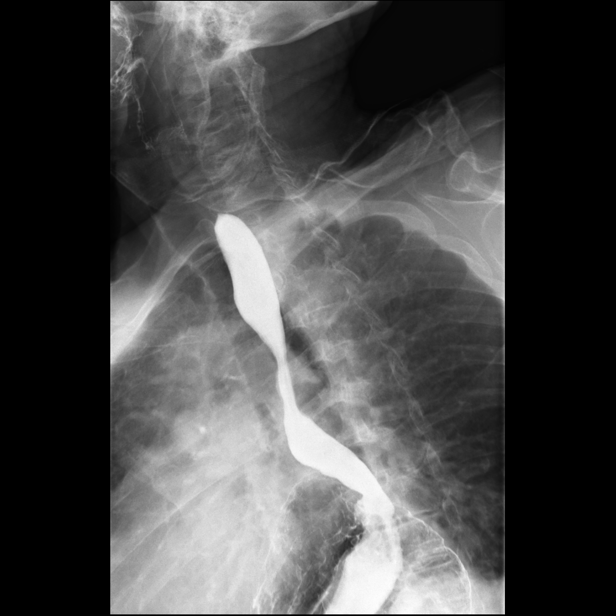
[frame 9/30]
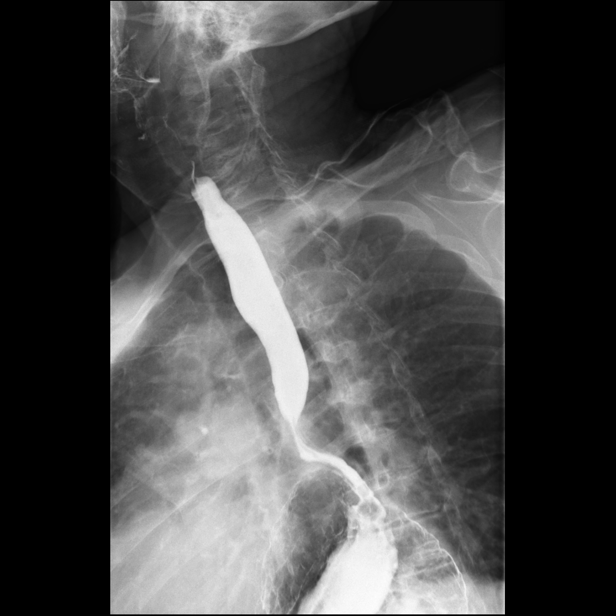

[Series 8: sequence · 0.28mm/px · 2 of 17 frames shown (6 of 8)]
[frame 3/17]
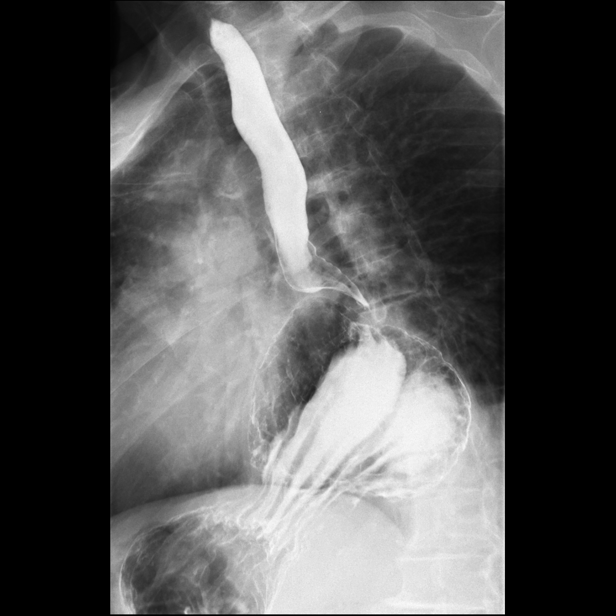
[frame 14/17]
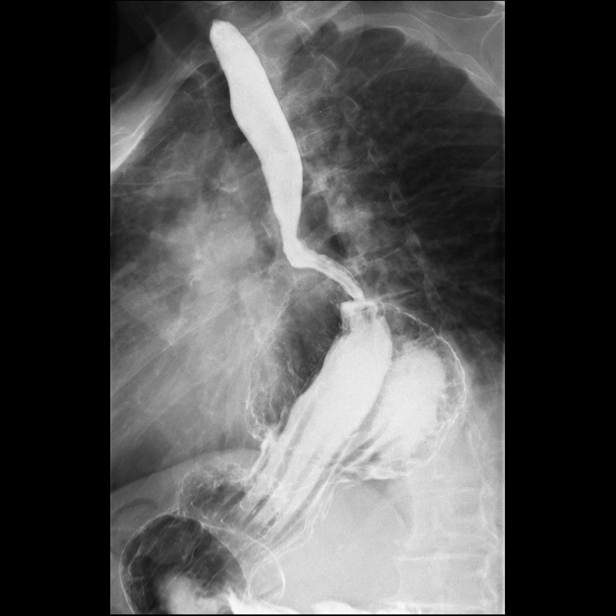

[Series 10: sequence · 3 of 75 frames shown (7 of 8)]
[frame 12/75]
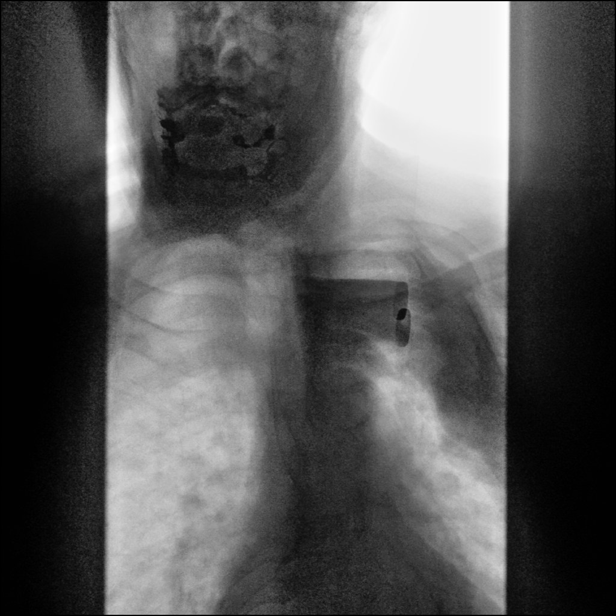
[frame 16/75]
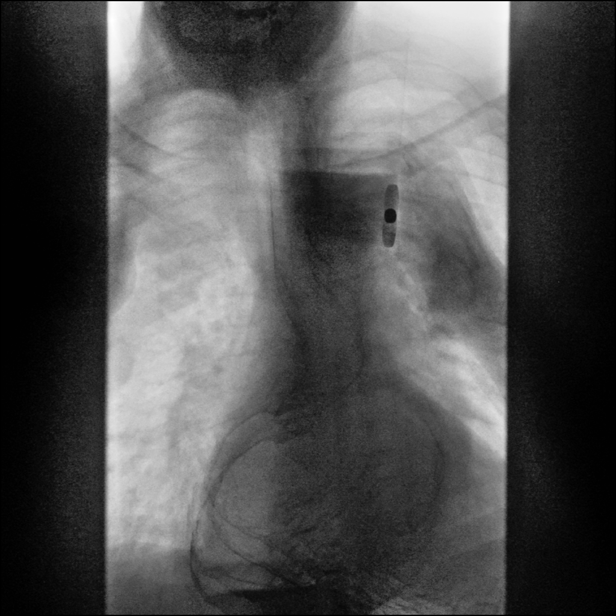
[frame 64/75]
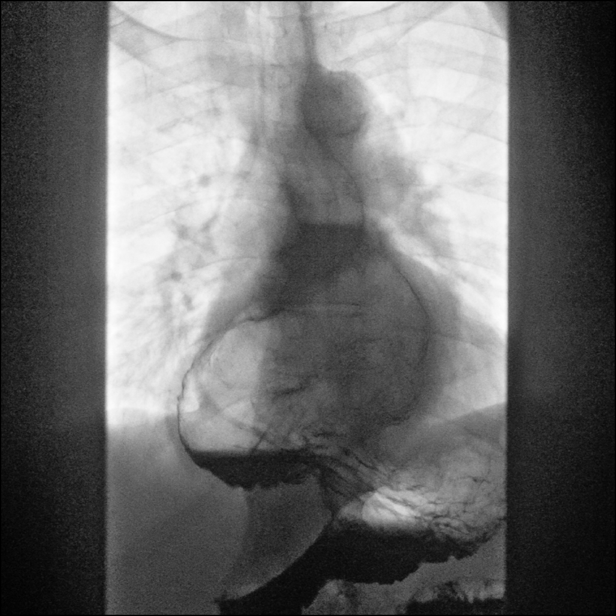

[Series 12: sequence · 0.28mm/px · 1 of 2 frames shown (8 of 8)]
[frame 2/2]
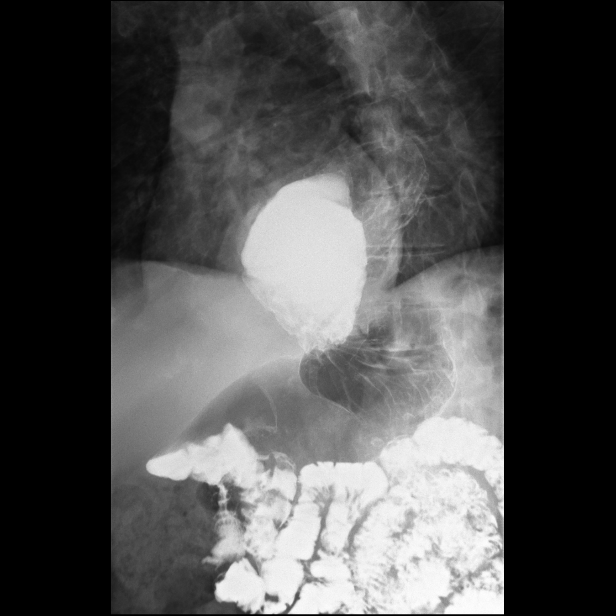

[14 of 24 positions shown; findings below may reference images not displayed]

FINDINGS: There is a large hiatal hernia. On initial imaging with the patient
upright, the large hiatal hernia did not fill with contrast,
suggesting a paraesophageal hernia . With additional images, it
became apparent that the GE junction is above the diaphragm with
gastric rugae extending above the diaphragm to the GE junction. The
larger component of hernia appears alongside the sliding hiatal
hernia. No obstruction is identified.

Barium tablet passed readily into the hiatal hernia without
obstruction.

Mild gastroesophageal reflux with water siphon.

Gastric mucosa is normal without ulcer or mass. There is early
emptying of the stomach into the duodenal bulb which appears normal.
Negative for ulcer.
IMPRESSION: Large hiatal hernia. The gastroesophageal junction appears above the
diaphragm. There is a large portion hernia which does not initially
fill with barium and may be an associated paraesophageal hernia.

Negative for stricture or mass.

Mild gastroesophageal reflux.

## 2021-04-07 ENCOUNTER — Emergency Department
Admission: EM | Admit: 2021-04-07 | Discharge: 2021-04-07 | Disposition: A | Discharge status: Home / Self Care | Payer: Medicare Other | Source: Home / Self Care

## 2021-04-07 DIAGNOSIS — Z20822 Contact with and (suspected) exposure to covid-19: Secondary | ICD-10-CM | POA: Diagnosis not present

## 2021-04-07 DIAGNOSIS — J029 Acute pharyngitis, unspecified: Secondary | ICD-10-CM

## 2021-04-07 MED ORDER — BENZONATATE 200 MG PO CAPS: 200.0000 mg | ORAL_CAPSULE | Freq: Three times a day (TID) | ORAL | 0 refills | Status: AC | PRN

## 2021-04-07 NOTE — Discharge Instructions (Addendum)
Stay quarantined until your covid test comes back which should be in 1-2 days

## 2021-04-07 NOTE — ED Triage Notes (Signed)
Pt presents to Urgent Care with c/o sore throat and cough since yesterday. Reports he has been exposed to [place]; has been vaccinated.

## 2021-04-07 NOTE — ED Provider Notes (Signed)
[name]    CSN: [id number] Arrival date & time: 04/07/21  1625      History   Chief Complaint Chief Complaint  Patient presents with   Sore Throat   Cough    HPI Phillip Monroe is a 86 y.o. male who presents with onset of ST and cough since yesterday. Has been exposed to covid with his son's mother in law.  Has had covid injections. He is a little tired, cough is not to bad. His appetite is unchanged.     Past Medical History:  Diagnosis Date   Actinic keratoses    Bladder cancer ([place])    Hemorrhoids    Hypertension     There are no problems to display for this patient.   Past Surgical History:  Procedure Laterality Date   APPENDECTOMY     COLONOSCOPY     INGUINAL HERNIA REPAIR         Home Medications    Prior to Admission medications   Medication Sig Start Date End Date Taking? Authorizing Provider  benzonatate (TESSALON) 200 MG capsule Take 1 capsule (200 mg total) by mouth 3 (three) times daily as needed for cough. 04/07/21  Yes Rodriguez-Southworth, Sunday Spillers, PA-C  tamsulosin (FLOMAX) 0.4 MG CAPS capsule Take 0.4 mg by mouth.   Yes [provider]  atenolol (TENORMIN) 100 MG tablet Take 100 mg by mouth daily.    [provider]  Fish Oil-Cholecalciferol (FISH OIL + D3) 1000-1000 MG-UNIT CAPS Take by mouth.    [provider]  hydrocortisone (ANUSOL-HC) 2.5 % rectal cream Place 1 application rectally 2 (two) times daily.    [provider]  omeprazole (PRILOSEC) 20 MG capsule Take 20 mg by mouth daily.    [provider]  vitamin E 100 UNIT capsule Take by mouth daily.    [provider]    Family History Family History  Problem Relation Age of Onset   Stroke Mother    Heart attack Father     Social History Social History   Tobacco Use   Smoking status: Former    Pack years: 0.00   Smokeless tobacco: Never  Vaping Use   Vaping Use: Never used  Substance Use Topics   Alcohol  use: No   Drug use: No     Allergies   Nsaids   Review of Systems Review of Systems + cough, scratchy throat, mild rhinitis, fatigue. The rest is neg.   Physical Exam Triage Vital Signs ED Triage Vitals  Enc Vitals Group     BP 04/07/21 1653 127/76     Pulse Rate 04/07/21 1653 69     Resp 04/07/21 1653 20     Temp 04/07/21 1653 98.8 F (37.1 C)     Temp Source 04/07/21 1653 Oral     SpO2 04/07/21 1653 97 %     Weight 04/07/21 1649 133 lb (60.3 kg)     Height 04/07/21 1649 5\' 6"  (1.676 m)     Head Circumference --      Peak Flow --      Pain Score 04/07/21 1649 0     Pain Loc --      Pain Edu? --      Excl. in [place]? --    No data found.  Updated Vital Signs BP 127/76 (BP Location: Right Arm)   Pulse 69   Temp 98.8 F (37.1 C) (Oral)   Resp 20   Ht 5\' 6"  (1.676  m)   Wt 133 lb (60.3 kg)   SpO2 97%   BMI 21.47 kg/m   Visual Acuity Right Eye Distance:   Left Eye Distance:   Bilateral Distance:    Right Eye Near:   Left Eye Near:    Bilateral Near:     Physical Exam Physical Exam Vitals signs and nursing note reviewed.  Constitutional:      General: She is not in acute distress.    Appearance: Normal appearance. he is not ill-appearing, toxic-appearing or diaphoretic.  HENT:     Head: Normocephalic.     Right Ear: Tympanic membrane, ear canal and external ear normal.     Left Ear: Tympanic membrane, ear canal and external ear normal.     Nose: Nose normal.     Mouth/Throat:     Mouth: Mucous membranes are moist.  Eyes:     General: No scleral icterus.       Right eye: No discharge.        Left eye: No discharge.     Conjunctiva/sclera: Conjunctivae normal.  Neck:     Musculoskeletal: Neck supple. No neck rigidity.  Cardiovascular:     Rate and Rhythm: Normal rate and regular rhythm.     Heart sounds: No murmur.  Pulmonary:     Effort: Pulmonary effort is normal.     Breath sounds: Normal breath sounds.  Musculoskeletal: Normal range of motion.   Lymphadenopathy:     Cervical: No cervical adenopathy.  Skin:    General: Skin is warm and dry.     Coloration: Skin is not jaundiced.     Findings: No rash.  Neurological:     Mental Status: he is alert and oriented to person, place, and time.     Gait: Gait normal.  Psychiatric:        Mood and Affect: Mood normal.        Behavior: Behavior normal.        Thought Content: Thought content normal.        Judgment: Judgment normal.    UC Treatments / Results  Labs (all labs ordered are listed, but only abnormal results are displayed) Labs Reviewed  SARS CORONAVIRUS 2 (TAT 6-24 HRS)  NOVEL CORONAVIRUS, NAA    EKG   Radiology No results found.  Procedures Procedures (including critical care time)  Medications Ordered in UC Medications - No data to display  Initial Impression / Assessment and Plan / UC Course  I have reviewed the triage vital signs and the nursing notes. Has URI with covid exposure. Covid test ordered. I sent Tessalon perless as noted for cough in the mean time. We will call him if the results are positive.    Final Clinical Impressions(s) / UC Diagnoses   Final diagnoses:  Close exposure to COVID-19 virus  Pharyngitis, unspecified etiology     Discharge Instructions      Stay quarantined until your covid test comes back which should be in 1-2 days     ED Prescriptions     Medication Sig Dispense Auth. Provider   benzonatate (TESSALON) 200 MG capsule Take 1 capsule (200 mg total) by mouth 3 (three) times daily as needed for cough. 30 capsule Rodriguez-Southworth, Sunday Spillers, PA-C      PDMP not reviewed this encounter.   Shelby Mattocks, [place] 04/07/21 1818

## 2021-04-09 ENCOUNTER — Telehealth: Payer: Self-pay | Admitting: Internal Medicine

## 2021-04-09 LAB — NOVEL CORONAVIRUS, NAA: SARS-CoV-2, NAA: DETECTED — AB

## 2021-04-09 LAB — SARS-COV-2, NAA 2 DAY TAT

## 2021-04-09 MED ORDER — MOLNUPIRAVIR 200 MG PO CAPS: 800.0000 mg | ORAL_CAPSULE | Freq: Two times a day (BID) | ORAL | 0 refills | Status: AC

## 2021-04-09 NOTE — Telephone Encounter (Signed)
I spoke with pt and informed him I was told by one of the nurses that his covid test is positive and I sent rx for Molnupiravir.

## 2021-05-14 IMAGING — US US THYROID
1 series · 13 of 25 positions shown · non-contrast
Comparison: 12/25/2019

CLINICAL DATA: Prior ultrasound follow-up.

EXAM:
THYROID ULTRASOUND
TECHNIQUE: Ultrasound examination of the thyroid gland and adjacent soft
tissues was performed.

[Series 1: us thyroid · 0.04mm/px · 13 of 51 slices shown]
[im 1/51]
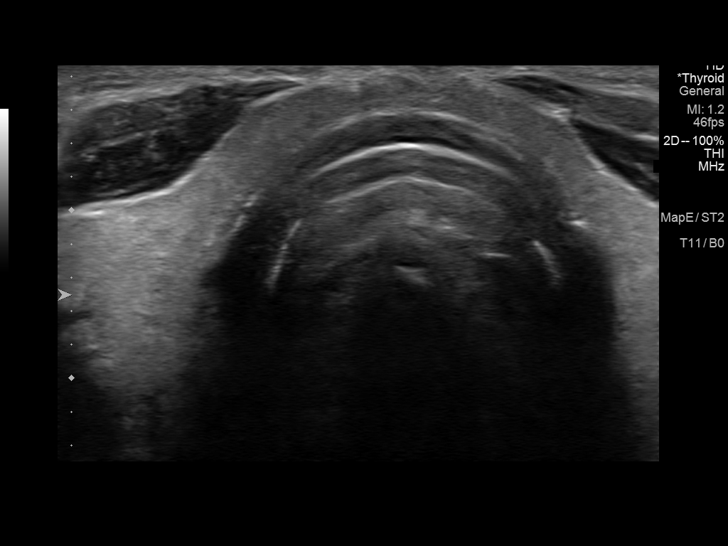
[im 5/51]
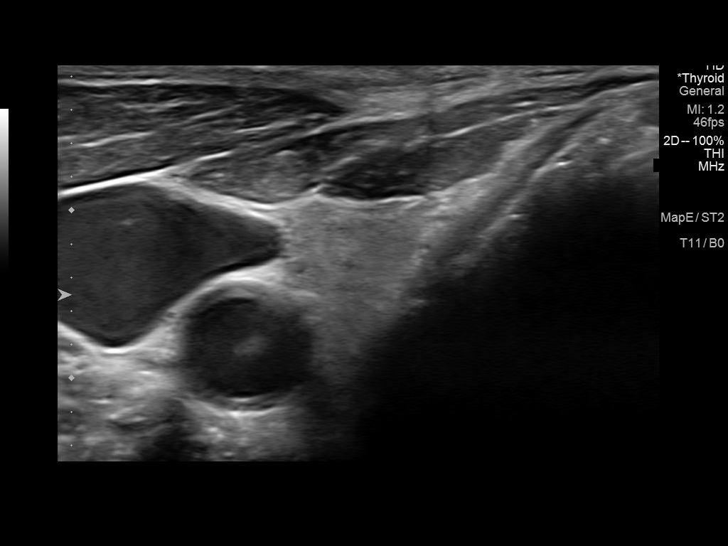
[im 9/51]
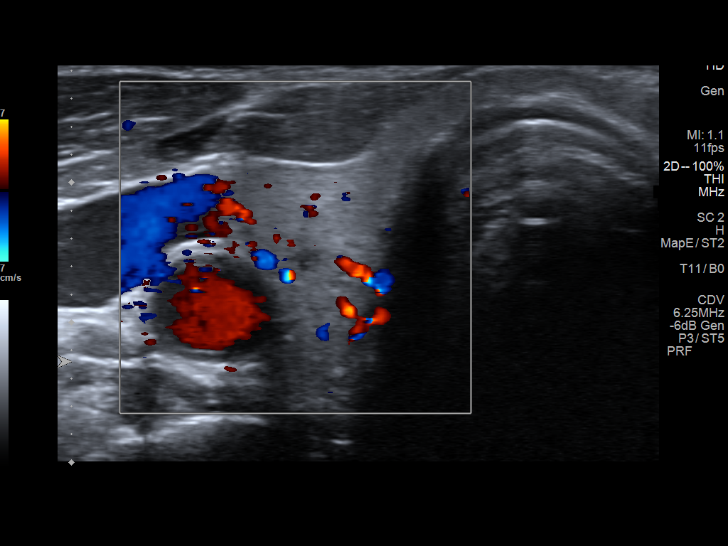
[im 13/51]
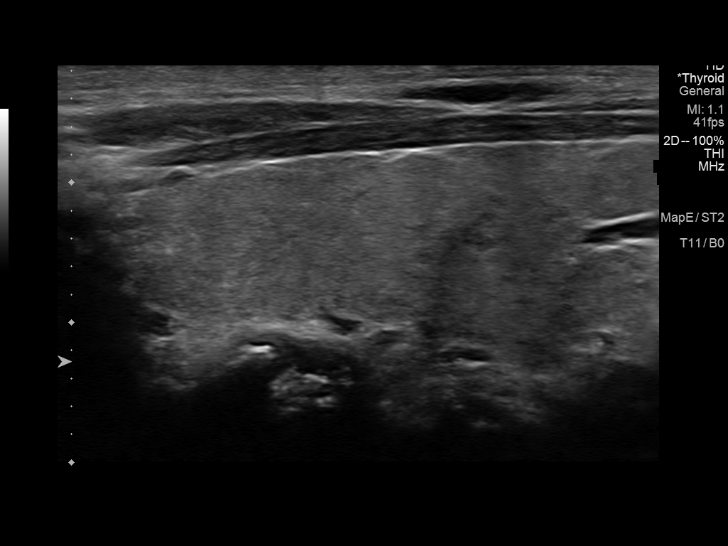
[im 17/51]
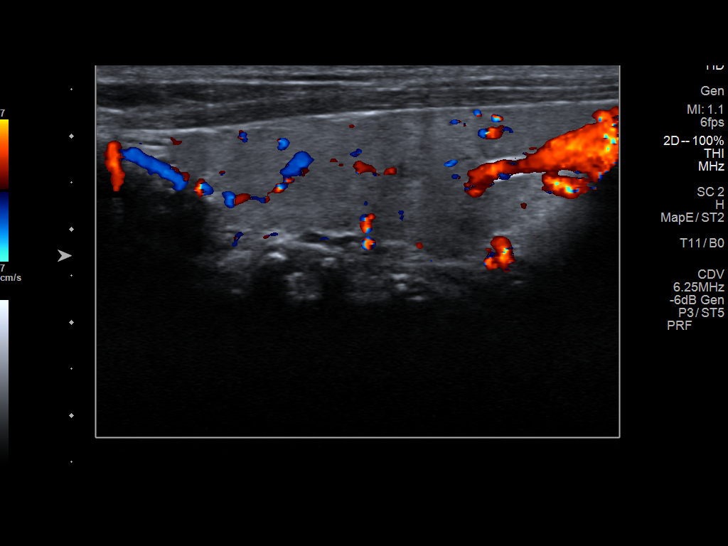
[im 21/51]
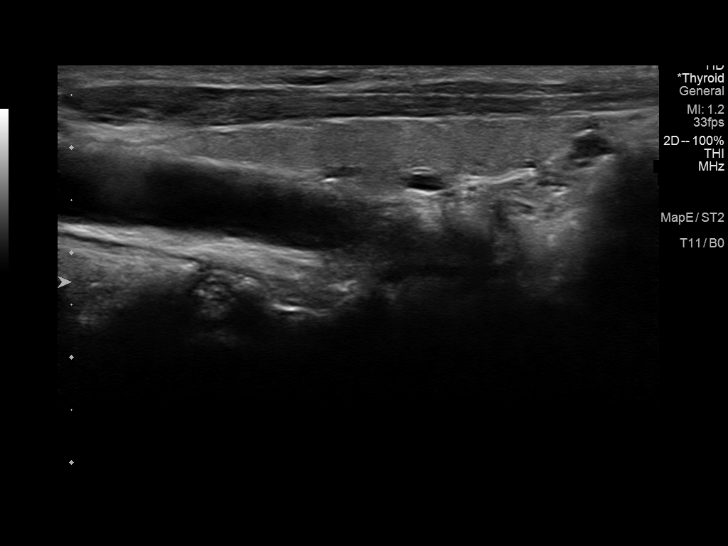
[im 26/51]
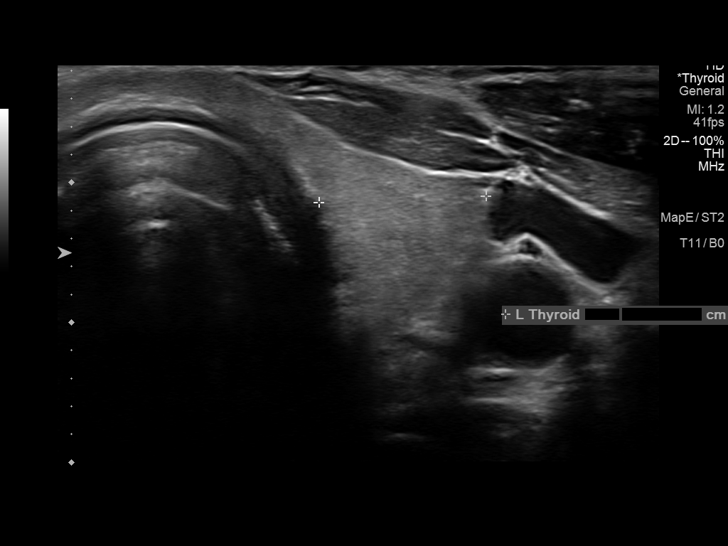
[im 30/51]
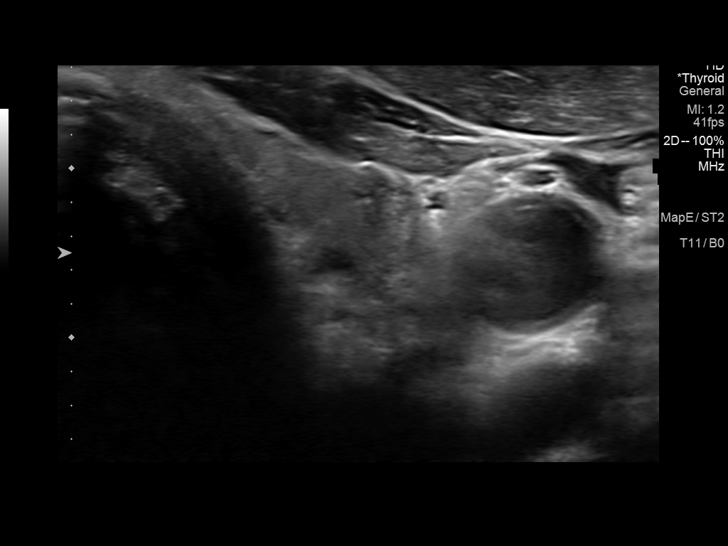
[im 34/51]
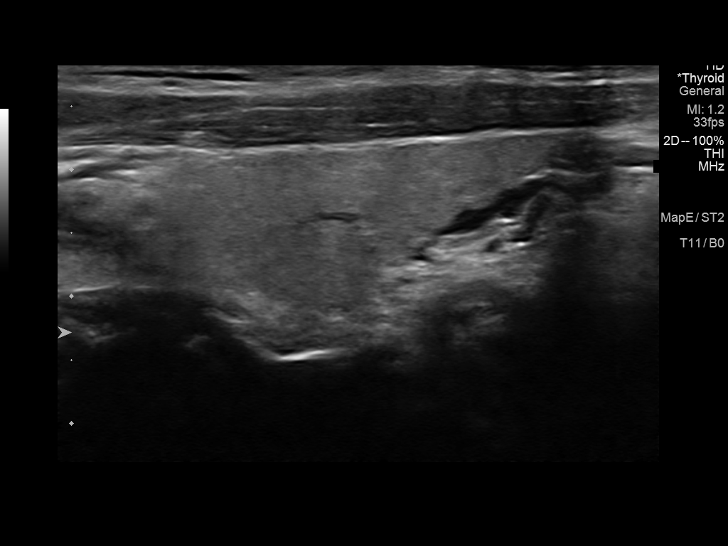
[im 38/51]
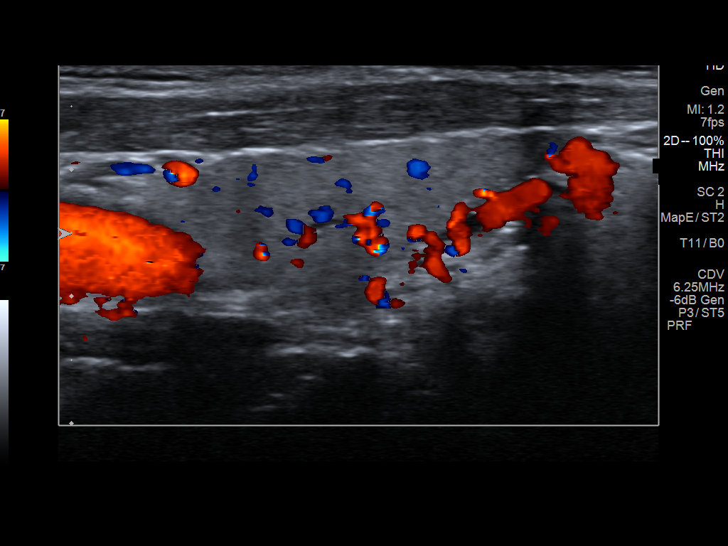
[im 42/51]
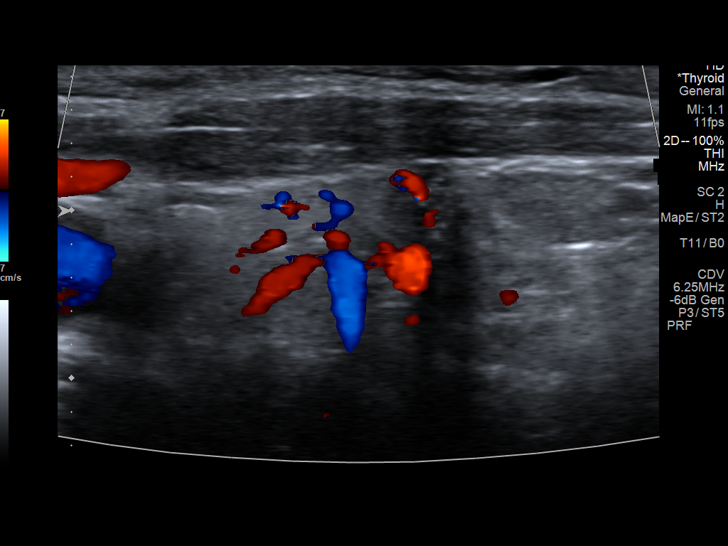
[im 46/51]
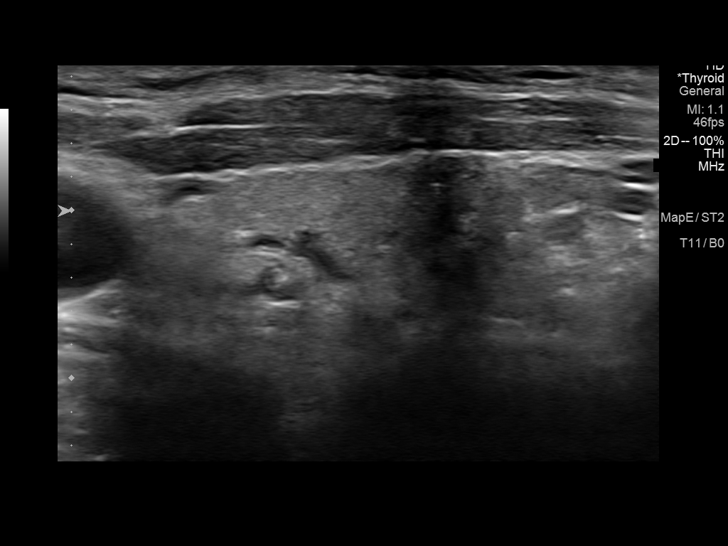
[im 51/51]
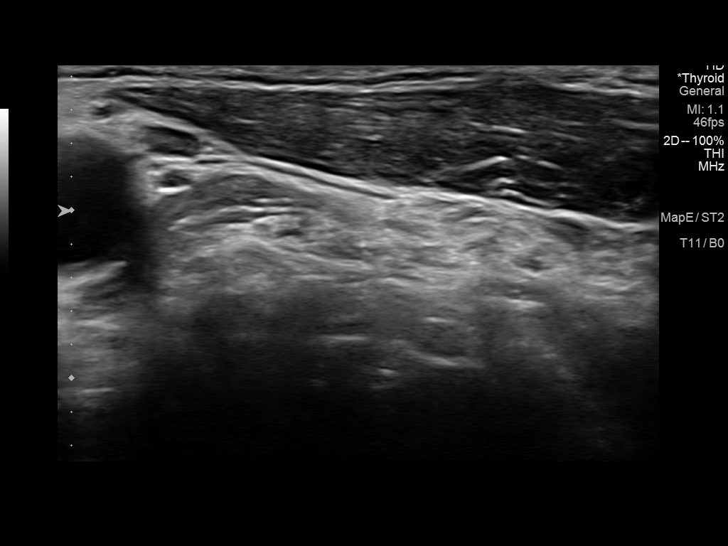

[13 of 25 positions shown; findings below may reference images not displayed]

FINDINGS: Parenchymal Echotexture: Mildly heterogenous

Isthmus: 0.2 cm

Right lobe: 5.0 x 1.8 x 1.3 cm

Left lobe: 3.6 x 1.3 x 1.2 cm

_________________________________________________________

Estimated total number of nodules >/= 1 cm: 0

Number of spongiform nodules >/=  2 cm not described below (TR1): 0

Number of mixed cystic and solid nodules >/= 1.5 cm not described
below (TR2): 0

_________________________________________________________

Nodule # 1:

Prior biopsy: No

Location: Left; Inferior

Maximum size: 0.5 cm; Other 2 dimensions: 0.4 x 0.4 cm, previously,
0.9 x 0.8 x 0.8 cm

Composition: solid/almost completely solid (2)

Echogenicity: hypoechoic (2)

Shape: not taller-than-wide (0)

Margins: ill-defined (0)

Echogenic foci: macrocalcifications (1)

ACR TI-RADS total points: 5.

ACR TI-RADS risk category:  TR4 (4-6 points).

Significant change in size (>/= 20% in two dimensions and minimal
increase of 2 mm): Yes

Change in features: Yes

Change in ACR TI-RADS risk category: Yes

ACR TI-RADS recommendations:

The left inferior nodule demonstrates significant regression in
appearance and size since the prior ultrasound. Given size (</=0.9
cm) and appearance, this nodule does NOT meet TI-RADS criteria for
biopsy or dedicated follow-up.

_________________________________________________________

No abnormal lymph nodes identified.
IMPRESSION: The left inferior thyroid nodule has regressed significantly since
the prior ultrasound. This nodule no longer meets criteria for
dedicated follow-up.

The above is in keeping with the ACR TI-RADS recommendations - [HOSPITAL] 2312;[DATE].

## 2022-02-18 DIAGNOSIS — R350 Frequency of micturition: Secondary | ICD-10-CM | POA: Diagnosis not present

## 2022-02-18 DIAGNOSIS — I10 Essential (primary) hypertension: Secondary | ICD-10-CM | POA: Diagnosis not present

## 2022-02-18 DIAGNOSIS — M25561 Pain in right knee: Secondary | ICD-10-CM | POA: Diagnosis not present

## 2022-02-18 DIAGNOSIS — M25512 Pain in left shoulder: Secondary | ICD-10-CM | POA: Diagnosis not present

## 2022-03-01 DIAGNOSIS — M25561 Pain in right knee: Secondary | ICD-10-CM | POA: Diagnosis not present

## 2022-03-01 DIAGNOSIS — M25512 Pain in left shoulder: Secondary | ICD-10-CM | POA: Diagnosis not present

## 2022-03-17 ENCOUNTER — Ambulatory Visit: Payer: Medicare Other | Admitting: Physical Therapy

## 2022-03-29 DIAGNOSIS — M1711 Unilateral primary osteoarthritis, right knee: Secondary | ICD-10-CM | POA: Diagnosis not present

## 2022-03-29 DIAGNOSIS — M25512 Pain in left shoulder: Secondary | ICD-10-CM | POA: Diagnosis not present

## 2022-03-30 ENCOUNTER — Encounter: Payer: Self-pay | Admitting: Physical Therapy

## 2022-03-30 ENCOUNTER — Other Ambulatory Visit: Payer: Self-pay

## 2022-03-30 ENCOUNTER — Ambulatory Visit: Payer: Medicare Other | Attending: Family Medicine | Admitting: Physical Therapy

## 2022-03-30 DIAGNOSIS — M25612 Stiffness of left shoulder, not elsewhere classified: Secondary | ICD-10-CM | POA: Diagnosis not present

## 2022-03-30 DIAGNOSIS — M25561 Pain in right knee: Secondary | ICD-10-CM | POA: Diagnosis not present

## 2022-03-30 DIAGNOSIS — M25512 Pain in left shoulder: Secondary | ICD-10-CM | POA: Insufficient documentation

## 2022-03-30 DIAGNOSIS — R293 Abnormal posture: Secondary | ICD-10-CM | POA: Diagnosis not present

## 2022-03-30 DIAGNOSIS — M6281 Muscle weakness (generalized): Secondary | ICD-10-CM | POA: Insufficient documentation

## 2022-03-30 DIAGNOSIS — G8929 Other chronic pain: Secondary | ICD-10-CM | POA: Insufficient documentation

## 2022-03-30 NOTE — Therapy (Addendum)
[facility] [place], [place], [address] Phone: [phone]   Fax:  [phone]  Physical Therapy Evaluation  Patient Details  Name: Phillip Monroe MRN: [id number] Date of Birth: Apr 30, 1935 Referring Provider (PT): Kristen Loader, FNP   Encounter Date: 03/30/2022  Rationale for Evaluation and Treatment Rehabilitation   PT End of Session - 03/30/22 1323     Visit Number 1    Date for PT Re-Evaluation 05/11/22    Authorization Type UHC MCR    Progress Note Due on Visit 10    PT Start Time 1324    PT Stop Time 1405    PT Time Calculation (min) 41 min    Activity Tolerance Patient tolerated treatment well    Behavior During Therapy [facility] for tasks assessed/performed             Past Medical History:  Diagnosis Date   Actinic keratoses    Bladder cancer ([place])    Hemorrhoids    Hypertension     Past Surgical History:  Procedure Laterality Date   APPENDECTOMY     COLONOSCOPY     INGUINAL HERNIA REPAIR      There were no vitals filed for this visit.    Subjective Assessment - 03/30/22 1327     Subjective Patient started having left shoulder and right knee pain about a year ago. It has progressed recently. He had an injection 3 weeks ago in the right knee and this has helped. His left shoulder pops and cracks and it hurts to lie on it. His knee gives way, pops and hurts when he descends stairs.    Pertinent History HTN, bladder CA (1989)    Diagnostic tests xrays knee; cartilage worn down on one side; shoulder also degenerating per pt.    Patient Stated Goals get them feeling better    Currently in Pain? No/denies    Pain Score 2     Pain Location Shoulder    Pain Orientation Left    Pain Descriptors / Indicators Aching    Pain Type Chronic pain    Pain Onset More than a month ago    Pain Frequency Intermittent    Aggravating Factors  lying on it    Pain Relieving Factors moving it    Multiple  Pain Sites Yes    Pain Score --   unable to rate   Pain Location Knee    Pain Orientation Right    Pain Descriptors / Indicators Sharp    Pain Type Chronic pain    Pain Onset More than a month ago    Pain Frequency Intermittent    Aggravating Factors  steps    Pain Relieving Factors goes away quickly                [facility] PT Assessment - 03/30/22 0001       Assessment   Medical Diagnosis M25.512 (ICD-10-CM) - Pain in left shoulder  M25.561 (ICD-10-CM) - Pain in right knee    Referring Provider (PT) Kristen Loader, FNP    Onset Date/Surgical Date 03/02/22    Hand Dominance Right    Prior Therapy no      Precautions   Precautions None      Restrictions   Weight Bearing Restrictions No      Balance Screen   Has the patient fallen in the past 6 months No    Has the patient had a decrease in activity  level because of a fear of falling?  No    Is the patient reluctant to leave their home because of a fear of falling?  No      Home Ecologist residence    Home Access Stairs to enter    Entrance Stairs-Number of Steps 4    Entrance Stairs-Rails Can reach both    [place] One level    Additional Comments steps front and back      Prior Function   Level of [place] Retired    Leisure mowing pushing the [place] hurts the knee some      Observation/Other Assessments   Focus on Therapeutic Outcomes (FOTO)  shoulder 59 (67 predicted)      Functional Tests   Functional tests Step up;Step down      Step Up   Comments WNL      Step Down   Comments when stepping down with left; poor eccentric quad control on right and pt limits R knee flexion and twists hip to compensate      Posture/Postural Control   Posture/Postural Control Postural limitations    Postural Limitations Rounded Shoulders;Forward head;Increased thoracic kyphosis;Posterior pelvic tilt    Posture Comments stands with flexed knees bil       ROM / Strength   AROM / PROM / Strength AROM;PROM;Strength      AROM   Overall AROM Comments R knee 1-128 deg; L knee 0-129 deg    AROM Assessment Site Shoulder    Right/Left Shoulder Left    Left Shoulder Extension --   full   Left Shoulder Flexion 138 Degrees   130 in standing   Left Shoulder ABduction 138 Degrees    Left Shoulder Internal Rotation 60 Degrees    Left Shoulder External Rotation 74 Degrees      PROM   PROM Assessment Site Shoulder    Right/Left Shoulder Left    Left Shoulder Flexion 178 Degrees   155 standing   Left Shoulder ABduction 180 Degrees    Left Shoulder Internal Rotation 70 Degrees    Left Shoulder External Rotation 80 Degrees      Strength   Overall Strength Comments B hip fles and ext 4+/5, ABD 5/5; DF 5/5    Strength Assessment Site Shoulder;Knee    Right/Left Shoulder Left    Left Shoulder Flexion 4/5    Left Shoulder Extension 4+/5   pop on release   Left Shoulder ABduction 5/5    Left Shoulder Internal Rotation 4+/5    Left Shoulder External Rotation 5/5    Right/Left Knee Right;Left    Right Knee Flexion 4+/5    Right Knee Extension 5/5    Left Knee Flexion 4/5    Left Knee Extension 5/5      Flexibility   Soft Tissue Assessment /Muscle Length yes    Hamstrings marked B L > R    ITB +ober R    Piriformis bil L > R      Palpation   Patella mobility WNL    Palpation comment unremarkable in shoulder and knee      Ambulation/Gait   Ambulation/Gait Yes    Ambulation/Gait Assistance 7: Independent    Ambulation Distance (Feet) 20 Feet    Assistive device None    Gait Pattern Step-through pattern;Decreased stride length    Ambulation Surface Level  Objective measurements completed on examination: See above findings.                PT Education - 03/30/22 1419     Education Details HEP; POC discussed    Person(s) Educated Patient    Methods Explanation;Demonstration;Handout     Comprehension Verbalized understanding;Returned demonstration              PT Short Term Goals - 03/30/22 1420       PT SHORT TERM GOAL #1   Title ind with initial HEP    Time 2    Period Weeks    Status New    Target Date 04/13/22               PT Long Term Goals - 03/30/22 1420       PT LONG TERM GOAL #1   Title Patient to demonstrate improved L shoulder active flexion and scaption to 150 deg or better to normalize ADLS.    Time 6    Period Weeks    Status New    Target Date 05/11/22      PT LONG TERM GOAL #2   Title Decreased pain with sleeping on left shoulder by 75% or more.      PT LONG TERM GOAL #3   Title Improved L shoulder strength to 5/5 to improve function.    Time 6    Period Weeks    Status New      PT LONG TERM GOAL #4   Title Pt able to descend stairs and walk down declines with a normal gait pattern and decreased pain by 75% or more.    Time 6    Period Weeks    Status New      PT LONG TERM GOAL #5   Title Improved B hip and knee strength to 5/5 to improve function.    Time 6    Period Weeks    Status New      Additional Long Term Goals   Additional Long Term Goals Yes      PT LONG TERM GOAL #6   Title improved shoulder FOTO to 67 or better demonstrating improved function    Time 6    Period Weeks    Status New                    Plan - 03/30/22 1412     Clinical Impression Statement Patient presents to PT with c/o of left shoulder and right knee pain both starting about one year ago and progressively worsening. Pt had an injection 3 wks ago in his knee which has helped but he still has pain and weakness mainly with descending steps and sometimes with walking. His L shoulder pain is primarily when lying on the shoulder but also with certain movements. 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 E[place]an has limitations in left shoulder ROM and strength as well as postural deficits including forward head and rounded shoulders. He  has deficits in B LE strength and mild tightness in R knee extension and moderate tightness in his R hip. He demonstrates eccentric weakness in the left knee when descending stairs and modifies his gait to compensate for this. He will benefit from skilled PT to address these defiicts.    Examination-Activity Limitations Stairs    Stability/Clinical Decision Making Stable/Uncomplicated    Clinical Decision Making Low    Rehab Potential Excellent    PT Frequency 2x / week  PT Duration 6 weeks    PT Treatment/Interventions ADLs/Self Care Home Management;Cryotherapy;Electrical Stimulation;Iontophoresis '4mg'$ /ml Dexamethasone;Moist Heat;Neuromuscular re-education;Balance training;Therapeutic exercise;Therapeutic activities;Stair training;Gait training;Patient/family education;Manual techniques;Dry needling;Taping    PT Next Visit Plan Review HEP; R knee: work on eccentric quad strength and functional strengtheing and flexibilty for B LE; L shoulder: postural and shoulder strength, pec/lat flexibility    Consulted and Agree with Plan of Care Patient             Patient will benefit from skilled therapeutic intervention in order to improve the following deficits and impairments:  Decreased range of motion, Pain, Impaired flexibility, Postural dysfunction, Decreased strength, Abnormal gait  Visit Diagnosis: Chronic pain of right knee  Chronic left shoulder pain  Muscle weakness (generalized)  Stiffness of left shoulder, not elsewhere classified  Abnormal posture     Problem List There are no problems to display for this patient.   Madelyn Flavors, PT 03/30/2022, 2:39 PM  [facility] [place], [place], [address] Phone: [phone]   Fax:  [phone]  Name: Phillip Monroe MRN: [id number] Date of Birth: Jan 04, 1935

## 2022-03-30 NOTE — Patient Instructions (Signed)
Access Code: [email or web address] URL: [email or web address] Date: 03/30/2022 Prepared by: Almyra Free  Exercises - Supine Hamstring Stretch with Strap  - 2 x daily - 7 x weekly - 1 sets - 3 reps - 30-60 sec hold - Supine Piriformis Stretch with Leg Straight  - 2 x daily - 7 x weekly - 1 sets - 3 reps - 30 sec hold - Doorway Pec Stretch at 90 Degrees Abduction  - 1 x daily - 7 x weekly - 1 sets - 3 reps - 30-60 seconds hold - Shoulder Flexion Wall Slide with Towel (Mirrored)  - 1 x daily - 7 x weekly - 1 sets - 10 reps

## 2022-04-06 ENCOUNTER — Ambulatory Visit: Payer: Medicare Other | Admitting: Physical Therapy

## 2022-04-06 DIAGNOSIS — R293 Abnormal posture: Secondary | ICD-10-CM

## 2022-04-06 DIAGNOSIS — M6281 Muscle weakness (generalized): Secondary | ICD-10-CM

## 2022-04-06 DIAGNOSIS — M25561 Pain in right knee: Secondary | ICD-10-CM | POA: Diagnosis not present

## 2022-04-06 DIAGNOSIS — G8929 Other chronic pain: Secondary | ICD-10-CM

## 2022-04-06 DIAGNOSIS — M25612 Stiffness of left shoulder, not elsewhere classified: Secondary | ICD-10-CM

## 2022-04-06 DIAGNOSIS — M25512 Pain in left shoulder: Secondary | ICD-10-CM | POA: Diagnosis not present

## 2022-04-06 NOTE — Therapy (Signed)
[place], [place], [address] Phone: [phone]   Fax:  [phone]  Physical Therapy Treatment  Patient Details  Name: Phillip Monroe MRN: [id number] Date of Birth: 09/04/1935 Referring Provider (PT): Kristen Loader, FNP   Encounter Date: 04/06/2022 Rationale for Evaluation and Treatment Rehabilitation   PT End of Session - 04/06/22 1227     Visit Number 2    Date for PT Re-Evaluation 05/11/22    Authorization Type UHC MCR    Authorization - Visit Number 2    Progress Note Due on Visit 10    PT Start Time 1150    PT Stop Time 1228    PT Time Calculation (min) 38 min    Activity Tolerance Patient tolerated treatment well    Behavior During Therapy [facility] for tasks assessed/performed             Past Medical History:  Diagnosis Date   Actinic keratoses    Bladder cancer ([place])    Hemorrhoids    Hypertension     Past Surgical History:  Procedure Laterality Date   APPENDECTOMY     COLONOSCOPY     INGUINAL HERNIA REPAIR      There were no vitals filed for this visit.   Subjective Assessment - 04/06/22 1157     Subjective Pt states he has tried the HEP. He states his knee is giving him trouble feeling like it will "give way" when he is going up/down stairs    Patient Stated Goals get them feeling better    Currently in Pain? Yes    Pain Score 3     Pain Location Knee    Pain Orientation Right    Pain Descriptors / Indicators Sore                [facility] PT Assessment - 04/06/22 0001       Assessment   Medical Diagnosis M25.512 (ICD-10-CM) - Pain in left shoulder  M25.561 (ICD-10-CM) - Pain in right knee    Referring Provider (PT) Kristen Loader, FNP    Onset Date/Surgical Date 03/02/22    Hand Dominance Right      Posture/Postural Control   Postural Limitations Rounded Shoulders;Forward head;Increased thoracic kyphosis;Posterior pelvic tilt                            [facility] Adult PT Treatment/Exercise - 04/06/22 0001       Exercises   Exercises Knee/Hip;Shoulder      Knee/Hip Exercises: Stretches   Passive Hamstring Stretch Right;2 reps;20 seconds    Other Knee/Hip Stretches figure 4 stretch 2 x 30 sec seated      Knee/Hip Exercises: Aerobic   Nustep L5 x 4 min for warm up      Knee/Hip Exercises: Standing   Step Down Limitations lateral step down 6'' step 1 UE support x 15, forward step down 1 UE support x 15 6'' step    Wall Squat 2 sets;10 reps    Walking with Sports Cord side step green TB 10 steps x 4      Shoulder Exercises: Standing   External Rotation Left;20 reps    Theraband Level (Shoulder External Rotation) Level 2 (Red)    Internal Rotation Left;20 reps    Theraband Level (Shoulder Internal Rotation) Level 2 (Red)    Extension 20 reps    Theraband Level (Shoulder Extension) Level 3 (  Green)    Row 20 reps    Theraband Level (Shoulder Row) Level 3 (Green)      Shoulder Exercises: Stretch   Other Shoulder Stretches doorway stretch 2 x 20 sec - cues for technique                       PT Short Term Goals - 03/30/22 1420       PT SHORT TERM GOAL #1   Title ind with initial HEP    Time 2    Period Weeks    Status New    Target Date 04/13/22               PT Long Term Goals - 03/30/22 1420       PT LONG TERM GOAL #1   Title Patient to demonstrate improved L shoulder active flexion and scaption to 150 deg or better to normalize ADLS.    Time 6    Period Weeks    Status New    Target Date 05/11/22      PT LONG TERM GOAL #2   Title Decreased pain with sleeping on left shoulder by 75% or more.      PT LONG TERM GOAL #3   Title Improved L shoulder strength to 5/5 to improve function.    Time 6    Period Weeks    Status New      PT LONG TERM GOAL #4   Title Pt able to descend stairs and walk down declines with a normal gait pattern and decreased pain by 75% or more.     Time 6    Period Weeks    Status New      PT LONG TERM GOAL #5   Title Improved B hip and knee strength to 5/5 to improve function.    Time 6    Period Weeks    Status New      Additional Long Term Goals   Additional Long Term Goals Yes      PT LONG TERM GOAL #6   Title improved shoulder FOTO to 67 or better demonstrating improved function    Time 6    Period Weeks    Status New                   Plan - 04/06/22 1322     Clinical Impression Statement Session focused on knee eccentric control and strengthening as well as posterior shoulder girdle strength. He requires cues for technique but with no increased pain during exercises today. Pt will continue to benefit from strengthenin to improve activity tolerance    PT Next Visit Plan Rt knee strength (eccentrics), postural strength    PT Home Exercise Plan [id number]    Consulted and Agree with Plan of Care Patient             Patient will benefit from skilled therapeutic intervention in order to improve the following deficits and impairments:     Visit Diagnosis: Chronic pain of right knee  Chronic left shoulder pain  Muscle weakness (generalized)  Stiffness of left shoulder, not elsewhere classified  Abnormal posture     Problem List There are no problems to display for this patient.   Dezi Brauner, PT 04/06/2022, 1:26 PM  [facility] [place], [place], [address] Phone: [phone]   Fax:  [phone]  Name: Phillip Monroe MRN: [id number] Date of Birth: [birth year]

## 2022-04-08 ENCOUNTER — Ambulatory Visit: Payer: Medicare Other | Admitting: Rehabilitative and Restorative Service Providers"

## 2022-04-08 ENCOUNTER — Encounter: Payer: Self-pay | Admitting: Rehabilitative and Restorative Service Providers"

## 2022-04-08 DIAGNOSIS — R293 Abnormal posture: Secondary | ICD-10-CM

## 2022-04-08 DIAGNOSIS — M6281 Muscle weakness (generalized): Secondary | ICD-10-CM | POA: Diagnosis not present

## 2022-04-08 DIAGNOSIS — G8929 Other chronic pain: Secondary | ICD-10-CM | POA: Diagnosis not present

## 2022-04-08 DIAGNOSIS — M25612 Stiffness of left shoulder, not elsewhere classified: Secondary | ICD-10-CM | POA: Diagnosis not present

## 2022-04-08 DIAGNOSIS — M25561 Pain in right knee: Secondary | ICD-10-CM | POA: Diagnosis not present

## 2022-04-08 DIAGNOSIS — M25512 Pain in left shoulder: Secondary | ICD-10-CM | POA: Diagnosis not present

## 2022-04-08 NOTE — Therapy (Signed)
[place], [place], [address] Phone: [phone]   Fax:  [phone]  Physical Therapy Treatment Rationale for Evaluation and Treatment Rehabilitation  Patient Details  Name: Phillip Monroe MRN: [id number] Date of Birth: 05/24/1935 Referring Provider (PT): Kristen Loader, FNP   Encounter Date: 04/08/2022   PT End of Session - 04/08/22 1023     Visit Number 3    Number of Visits 12    Date for PT Re-Evaluation 05/11/22    Authorization Type UHC MCR    Authorization - Visit Number 3    Progress Note Due on Visit 10    PT Start Time 1020    PT Stop Time 1100    PT Time Calculation (min) 40 min    Activity Tolerance Patient tolerated treatment well             Past Medical History:  Diagnosis Date   Actinic keratoses    Bladder cancer ([place])    Hemorrhoids    Hypertension     Past Surgical History:  Procedure Laterality Date   APPENDECTOMY     COLONOSCOPY     INGUINAL HERNIA REPAIR      There were no vitals filed for this visit.   Subjective Assessment - 04/08/22 1024     Subjective Phillip Monroe reports that he is doing OK. Not sore from exercises. He is working on his exercises at home.    Currently in Pain? Yes    Pain Score 3     Pain Location Knee    Pain Orientation Right    Pain Descriptors / Indicators Sore    Pain Type Chronic pain    Aggravating Factors  steps    Pain Relieving Factors goes away quickly    Pain Score 2    Pain Location Shoulder    Pain Orientation Left    Pain Descriptors / Indicators Sore    Pain Type Chronic pain    Pain Onset More than a month ago    Pain Frequency Intermittent    Aggravating Factors  lying on side    Pain Relieving Factors oving arm                               [facility] Adult PT Treatment/Exercise - 04/08/22 0001       Knee/Hip Exercises: Aerobic   Nustep L5 x 6 min for warm up      Knee/Hip Exercises:  Standing   Forward Step Up Left;15 reps;Hand Hold: 1;Step Height: 6"    Forward Step Up Limitations lowering down using Rt    Step Down Left;15 reps;Hand Hold: 2;Step Height: 6"    Step Down Limitations lowering wth Rt    Wall Squat 2 sets;10 reps    Walking with Sports Cord side step green TB 10 steps x 4      Shoulder Exercises: Standing   External Rotation Left;20 reps    Theraband Level (Shoulder External Rotation) Level 2 (Red)    Internal Rotation Left;20 reps    Theraband Level (Shoulder Internal Rotation) Level 2 (Red)    Extension 20 reps    Theraband Level (Shoulder Extension) Level 3 (Green)    Row 20 reps    Theraband Level (Shoulder Row) Level 3 (Green)    Other Standing Exercises scap squeeze with noodle 5 sec x 10 x 2 sets  Shoulder Exercises: Stretch   Other Shoulder Stretches doorway stretch 3 positions 2 x 30 sec - cues for technique                       PT Short Term Goals - 03/30/22 1420       PT SHORT TERM GOAL #1   Title ind with initial HEP    Time 2    Period Weeks    Status New    Target Date 04/13/22               PT Long Term Goals - 03/30/22 1420       PT LONG TERM GOAL #1   Title Patient to demonstrate improved L shoulder active flexion and scaption to 150 deg or better to normalize ADLS.    Time 6    Period Weeks    Status New    Target Date 05/11/22      PT LONG TERM GOAL #2   Title Decreased pain with sleeping on left shoulder by 75% or more.      PT LONG TERM GOAL #3   Title Improved L shoulder strength to 5/5 to improve function.    Time 6    Period Weeks    Status New      PT LONG TERM GOAL #4   Title Pt able to descend stairs and walk down declines with a normal gait pattern and decreased pain by 75% or more.    Time 6    Period Weeks    Status New      PT LONG TERM GOAL #5   Title Improved B hip and knee strength to 5/5 to improve function.    Time 6    Period Weeks    Status New       Additional Long Term Goals   Additional Long Term Goals Yes      PT LONG TERM GOAL #6   Title improved shoulder FOTO to 67 or better demonstrating improved function    Time 6    Period Weeks    Status New                   Plan - 04/08/22 1026     Clinical Impression Statement Patient continues to report pain and discomfort in Lt shoulder and Rt knee. Working on his exercises at home. Continued with strengthening and stablization Lt shoulder Rt knee    Rehab Potential Excellent    PT Frequency 2x / week    PT Duration 6 weeks    PT Treatment/Interventions ADLs/Self Care Home Management;Cryotherapy;Electrical Stimulation;Iontophoresis '4mg'$ /ml Dexamethasone;Moist Heat;Neuromuscular re-education;Balance training;Therapeutic exercise;Therapeutic activities;Stair training;Gait training;Patient/family education;Manual techniques;Dry needling;Taping    PT Next Visit Plan Rt knee strength (eccentrics), postural strength    PT Home Exercise Plan [email or web address]    Consulted and Agree with Plan of Care Patient             Patient will benefit from skilled therapeutic intervention in order to improve the following deficits and impairments:     Visit Diagnosis: Chronic pain of right knee  Chronic left shoulder pain  Muscle weakness (generalized)  Stiffness of left shoulder, not elsewhere classified  Abnormal posture     Problem List There are no problems to display for this patient.   Everardo All, PT, MPH  04/08/2022, 10:56 AM  [facility] [place], [place], [address] Phone: [phone]  Fax:  [phone]  Name: Phillip Monroe MRN: [id number] Date of Birth: 08-Jul-1935

## 2022-04-13 ENCOUNTER — Ambulatory Visit: Payer: Medicare Other | Admitting: Rehabilitative and Restorative Service Providers"

## 2022-04-13 ENCOUNTER — Encounter: Payer: Self-pay | Admitting: Rehabilitative and Restorative Service Providers"

## 2022-04-13 DIAGNOSIS — G8929 Other chronic pain: Secondary | ICD-10-CM

## 2022-04-13 DIAGNOSIS — R293 Abnormal posture: Secondary | ICD-10-CM

## 2022-04-13 DIAGNOSIS — M25612 Stiffness of left shoulder, not elsewhere classified: Secondary | ICD-10-CM | POA: Diagnosis not present

## 2022-04-13 DIAGNOSIS — M25512 Pain in left shoulder: Secondary | ICD-10-CM | POA: Diagnosis not present

## 2022-04-13 DIAGNOSIS — M6281 Muscle weakness (generalized): Secondary | ICD-10-CM

## 2022-04-13 DIAGNOSIS — M25561 Pain in right knee: Secondary | ICD-10-CM | POA: Diagnosis not present

## 2022-04-13 NOTE — Therapy (Signed)
[place], [place], [address] Phone: [phone]   Fax:  [phone]  Physical Therapy Treatment Rationale for Evaluation and Treatment Rehabilitation  Patient Details  Name: Phillip Monroe MRN: [id number] Date of Birth: 06/07/1935 Referring Provider (PT): Kristen Loader, FNP   Encounter Date: 04/13/2022   PT End of Session - 04/13/22 1406     Visit Number 4    Number of Visits 12    Date for PT Re-Evaluation 05/11/22    Authorization Type UHC MCR    Authorization - Visit Number 4    Progress Note Due on Visit 10    PT Start Time 3474    PT Stop Time 1445    PT Time Calculation (min) 42 min    Activity Tolerance Patient tolerated treatment well             Past Medical History:  Diagnosis Date   Actinic keratoses    Bladder cancer ([place])    Hemorrhoids    Hypertension     Past Surgical History:  Procedure Laterality Date   APPENDECTOMY     COLONOSCOPY     INGUINAL HERNIA REPAIR      There were no vitals filed for this visit.   Subjective Assessment - 04/13/22 1406     Subjective Feeling some better. Still has some pain in the shoulrer and the knee with certain things and they pop sometimes.    Currently in Pain? Yes    Pain Score 3     Pain Location Knee    Pain Orientation Right    Pain Descriptors / Indicators Sore    Pain Type Chronic pain    Pain Onset More than a month ago    Pain Frequency Intermittent    Pain Score 3    Pain Location Shoulder    Pain Orientation Left    Pain Descriptors / Indicators Sore    Pain Type Chronic pain    Pain Onset More than a month ago    Pain Frequency Intermittent                [facility] PT Assessment - 04/13/22 0001       Assessment   Medical Diagnosis M25.512 (ICD-10-CM) - Pain in left shoulder  M25.561 (ICD-10-CM) - Pain in right knee    Referring Provider (PT) Kristen Loader, FNP    Onset Date/Surgical Date 03/02/22     Hand Dominance Right      Strength   Left Shoulder Flexion 4+/5    Left Shoulder Extension 5/5    Left Shoulder ABduction 5/5    Left Shoulder Internal Rotation 4+/5   crepitus   Left Shoulder External Rotation 4+/5   crepitus                          [facility] Adult PT Treatment/Exercise - 04/13/22 0001       Knee/Hip Exercises: Aerobic   Nustep L5 x 5 min for warm up      Knee/Hip Exercises: Standing   Lateral Step Up Right;Hand Hold: 0;15 reps;Step Height: 6"    Forward Step Up Right;15 reps;Hand Hold: 2;Step Height: 6"    Forward Step Up Limitations lowering down using Rt    Step Down Left;15 reps;Hand Hold: 2;Step Height: 6"    Step Down Limitations lowering wth Rt    Functional Squat 15 reps    Functional Squat  Limitations UE support as needed    SLS 20 sec x 3 reps each side    Walking with Sports Cord side step blue TB 10 steps x 4      Knee/Hip Exercises: Seated   Sit to Sand 10 reps;without UE support   slow eccentric lowering stand to sit     Shoulder Exercises: Standing   External Rotation Strengthening;Left;10 reps    Theraband Level (Shoulder External Rotation) Level 2 (Red)    External Rotation Limitations reactive isometric    Internal Rotation Left;Strengthening;10 reps;Theraband    Theraband Level (Shoulder Internal Rotation) Level 2 (Red)    Internal Rotation Limitations reactive isometric    Extension 20 reps    Theraband Level (Shoulder Extension) Level 3 (Green)    Row 20 reps    Theraband Level (Shoulder Row) Level 3 (Green)    Other Standing Exercises scap squeeze with noodle 5 sec x 10 x 2 sets      Shoulder Exercises: ROM/Strengthening   Wall Pushups 10 reps    Modified Plank 5 reps;30 seconds    Modified Plank Limitations counter plank    Other ROM/Strengthening Exercises farmers carry 5# each side x 80 feet x 2 laps    Other ROM/Strengthening Exercises lifting 10 # Kb from 8 inch stool x 10 reps                        PT Short Term Goals - 03/30/22 1420       PT SHORT TERM GOAL #1   Title ind with initial HEP    Time 2    Period Weeks    Status New    Target Date 04/13/22               PT Long Term Goals - 03/30/22 1420       PT LONG TERM GOAL #1   Title Patient to demonstrate improved L shoulder active flexion and scaption to 150 deg or better to normalize ADLS.    Time 6    Period Weeks    Status New    Target Date 05/11/22      PT LONG TERM GOAL #2   Title Decreased pain with sleeping on left shoulder by 75% or more.      PT LONG TERM GOAL #3   Title Improved L shoulder strength to 5/5 to improve function.    Time 6    Period Weeks    Status New      PT LONG TERM GOAL #4   Title Pt able to descend stairs and walk down declines with a normal gait pattern and decreased pain by 75% or more.    Time 6    Period Weeks    Status New      PT LONG TERM GOAL #5   Title Improved B hip and knee strength to 5/5 to improve function.    Time 6    Period Weeks    Status New      Additional Long Term Goals   Additional Long Term Goals Yes      PT LONG TERM GOAL #6   Title improved shoulder FOTO to 67 or better demonstrating improved function    Time 6    Period Weeks    Status New                   Plan - 04/13/22 1408     Clinical  Impression Statement Some improvement in Rt knee; Lt shoulder pain. Working on his exercises at home. Treatment consisted of continued strengthening and stabilization Rt knee and Lt shoulder. Progressing with strengthening and stabilization exercises. Patient demonstrates increasing Lt shoulder strength.    Rehab Potential Excellent    PT Frequency 2x / week    PT Duration 6 weeks    PT Treatment/Interventions ADLs/Self Care Home Management;Cryotherapy;Electrical Stimulation;Iontophoresis '4mg'$ /ml Dexamethasone;Moist Heat;Neuromuscular re-education;Balance training;Therapeutic exercise;Therapeutic activities;Stair  training;Gait training;Patient/family education;Manual techniques;Dry needling;Taping    PT Next Visit Plan Rt knee strength (eccentrics), postural strength; LE/UE stabilization    PT Home Exercise Plan [id number]    Consulted and Agree with Plan of Care Patient             Patient will benefit from skilled therapeutic intervention in order to improve the following deficits and impairments:     Visit Diagnosis: Chronic pain of right knee  Chronic left shoulder pain  Muscle weakness (generalized)  Stiffness of left shoulder, not elsewhere classified  Abnormal posture     Problem List There are no problems to display for this patient.   Everardo All, PT, MPH  04/13/2022, 2:49 PM  [facility] [place], [place], [address] Phone: [phone]   Fax:  [phone]  Name: Phillip Monroe MRN: [id number] Date of Birth: 12-16-1934

## 2022-04-15 ENCOUNTER — Ambulatory Visit: Payer: Medicare Other | Admitting: Rehabilitative and Restorative Service Providers"

## 2022-04-15 ENCOUNTER — Encounter: Payer: Self-pay | Admitting: Rehabilitative and Restorative Service Providers"

## 2022-04-15 DIAGNOSIS — R293 Abnormal posture: Secondary | ICD-10-CM

## 2022-04-15 DIAGNOSIS — M25612 Stiffness of left shoulder, not elsewhere classified: Secondary | ICD-10-CM | POA: Diagnosis not present

## 2022-04-15 DIAGNOSIS — M25512 Pain in left shoulder: Secondary | ICD-10-CM | POA: Diagnosis not present

## 2022-04-15 DIAGNOSIS — G8929 Other chronic pain: Secondary | ICD-10-CM

## 2022-04-15 DIAGNOSIS — M6281 Muscle weakness (generalized): Secondary | ICD-10-CM | POA: Diagnosis not present

## 2022-04-15 DIAGNOSIS — M25561 Pain in right knee: Secondary | ICD-10-CM | POA: Diagnosis not present

## 2022-04-15 NOTE — Therapy (Signed)
[place], [place], [address] Phone: [phone]   Fax:  [phone]  Physical Therapy Treatment   Rationale for Evaluation and Treatment Rehabilitation  Patient Details  Name: Phillip Monroe MRN: [id number] Date of Birth: Dec 17, 1934 Referring Provider (PT): Kristen Loader, FNP   Encounter Date: 04/15/2022   PT End of Session - 04/15/22 1409     Visit Number 5    Number of Visits 12    Date for PT Re-Evaluation 05/11/22    Authorization Type UHC MCR    Authorization - Visit Number 5    Progress Note Due on Visit 10    PT Start Time 4196    PT Stop Time 1445    PT Time Calculation (min) 40 min    Activity Tolerance Patient tolerated treatment well             Past Medical History:  Diagnosis Date   Actinic keratoses    Bladder cancer ([place])    Hemorrhoids    Hypertension     Past Surgical History:  Procedure Laterality Date   APPENDECTOMY     COLONOSCOPY     INGUINAL HERNIA REPAIR      There were no vitals filed for this visit.   Subjective Assessment - 04/15/22 1409     Subjective Doing some exercises at home. Still having some popping but didn't have any trouble with increased exercises last visit.    Currently in Pain? No/denies    Pain Score 0-No pain    Pain Score 0    Pain Location Shoulder    Pain Orientation Left                               [facility] Adult PT Treatment/Exercise - 04/15/22 0001       Knee/Hip Exercises: Aerobic   Nustep L5 x 6 min U/LE's      Knee/Hip Exercises: Standing   Lateral Step Up Right;Step Height: 6";Left;20 reps;10 reps;Hand Hold: 1    Wall Squat 2 sets;10 reps;3 seconds    SLS 20 sec x 3 reps each side    Walking with Sports Cord sports cord backward/Rt/Lt side/forward x 5 reps each      Knee/Hip Exercises: Seated   Sit to Sand 10 reps;without UE support   slow eccentric lowering stand to sit     Shoulder  Exercises: Standing   External Rotation Limitations painful with popping today    Row Strengthening;Both;10 reps;Theraband    Theraband Level (Shoulder Row) Level 4 (Blue)    Row Limitations isometric step back      Shoulder Exercises: ROM/Strengthening   Wall Pushups 10 reps    Wall Pushups Limitations reverse wall push up elbows bent x 10 reps    Modified Plank 3 reps;45 seconds    Modified Plank Limitations counter plank hands resting on pillow    Other ROM/Strengthening Exercises farmers carry 5# each side x 80 feet x 5 laps    Other ROM/Strengthening Exercises lifting 15 # Kb from 8 inch stool x 10 reps x 2 sets      Shoulder Exercises: Stretch   Other Shoulder Stretches doorway stretch 3 positions 2 x 30 sec - cues for technique                       PT Short Term Goals - 03/30/22 1420  PT SHORT TERM GOAL #1   Title ind with initial HEP    Time 2    Period Weeks    Status New    Target Date 04/13/22               PT Long Term Goals - 03/30/22 1420       PT LONG TERM GOAL #1   Title Patient to demonstrate improved L shoulder active flexion and scaption to 150 deg or better to normalize ADLS.    Time 6    Period Weeks    Status New    Target Date 05/11/22      PT LONG TERM GOAL #2   Title Decreased pain with sleeping on left shoulder by 75% or more.      PT LONG TERM GOAL #3   Title Improved L shoulder strength to 5/5 to improve function.    Time 6    Period Weeks    Status New      PT LONG TERM GOAL #4   Title Pt able to descend stairs and walk down declines with a normal gait pattern and decreased pain by 75% or more.    Time 6    Period Weeks    Status New      PT LONG TERM GOAL #5   Title Improved B hip and knee strength to 5/5 to improve function.    Time 6    Period Weeks    Status New      Additional Long Term Goals   Additional Long Term Goals Yes      PT LONG TERM GOAL #6   Title improved shoulder FOTO to 67 or  better demonstrating improved function    Time 6    Period Weeks    Status New                   Plan - 04/15/22 1412     Clinical Impression Statement Continued strengthening for Lt shoulder; Rt knee. Lt shoulder pain with ER/IR. Continued with isometric stabilization and strengthening in neutral range avoiding joint crepitus and pain.    Rehab Potential Excellent    PT Frequency 2x / week    PT Duration 6 weeks    PT Treatment/Interventions ADLs/Self Care Home Management;Cryotherapy;Electrical Stimulation;Iontophoresis '4mg'$ /ml Dexamethasone;Moist Heat;Neuromuscular re-education;Balance training;Therapeutic exercise;Therapeutic activities;Stair training;Gait training;Patient/family education;Manual techniques;Dry needling;Taping    PT Next Visit Plan Rt knee strength (eccentrics), postural strength; LE/UE stabilization    PT Home Exercise Plan [id number]    Consulted and Agree with Plan of Care Patient             Patient will benefit from skilled therapeutic intervention in order to improve the following deficits and impairments:     Visit Diagnosis: Chronic pain of right knee  Chronic left shoulder pain  Muscle weakness (generalized)  Stiffness of left shoulder, not elsewhere classified  Abnormal posture     Problem List There are no problems to display for this patient.   Everardo All, PT, MPH  04/15/2022, 2:47 PM  [facility] [place], [place], [address] Phone: [phone]   Fax:  [phone]  Name: Phillip Monroe MRN: [id number] Date of Birth: 1935-07-17

## 2022-05-10 ENCOUNTER — Ambulatory Visit: Payer: Medicare Other | Attending: Family Medicine | Admitting: Rehabilitative and Restorative Service Providers"

## 2022-05-10 ENCOUNTER — Encounter: Payer: Self-pay | Admitting: Rehabilitative and Restorative Service Providers"

## 2022-05-10 DIAGNOSIS — G8929 Other chronic pain: Secondary | ICD-10-CM | POA: Diagnosis not present

## 2022-05-10 DIAGNOSIS — R293 Abnormal posture: Secondary | ICD-10-CM | POA: Insufficient documentation

## 2022-05-10 DIAGNOSIS — M6281 Muscle weakness (generalized): Secondary | ICD-10-CM | POA: Insufficient documentation

## 2022-05-10 DIAGNOSIS — M25512 Pain in left shoulder: Secondary | ICD-10-CM | POA: Diagnosis not present

## 2022-05-10 DIAGNOSIS — M25561 Pain in right knee: Secondary | ICD-10-CM | POA: Diagnosis not present

## 2022-05-10 DIAGNOSIS — M25612 Stiffness of left shoulder, not elsewhere classified: Secondary | ICD-10-CM | POA: Insufficient documentation

## 2022-05-10 NOTE — Therapy (Signed)
[place], [place], [address] Phone: [phone]   Fax:  [phone]  Physical Therapy Treatment  Rationale for Evaluation and Treatment Rehabilitation  Patient Details  Name: Phillip Monroe MRN: [id number] Date of Birth: 22-Apr-1935 Referring Provider (PT): Kristen Loader, FNP   Encounter Date: 05/10/2022   PT End of Session - 05/10/22 1320     Visit Number 6    Number of Visits 18    Date for PT Re-Evaluation 06/22/22    Authorization Type UHC MCR    Authorization - Visit Number 6    Progress Note Due on Visit 10    PT Start Time 1320    PT Stop Time 1400    PT Time Calculation (min) 40 min    Activity Tolerance Patient tolerated treatment well    Behavior During Therapy [facility] for tasks assessed/performed             Past Medical History:  Diagnosis Date   Actinic keratoses    Bladder cancer ([place])    Hemorrhoids    Hypertension     Past Surgical History:  Procedure Laterality Date   APPENDECTOMY     COLONOSCOPY     INGUINAL HERNIA REPAIR      There were no vitals filed for this visit.   Subjective Assessment - 05/10/22 1321     Subjective Doing some exercises at home. Still having some popping in the Lt shoulder and some pain in the Rt knee but not all the time.    Pertinent History HTN, bladder CA (1989)    Currently in Pain? Yes    Pain Score 0-No pain    Pain Location Knee    Pain Orientation Right    Pain Score 0   reaching up 4/10   Pain Location Shoulder    Pain Orientation Left    Pain Descriptors / Indicators Sore    Pain Type Chronic pain    Pain Onset More than a month ago    Aggravating Factors  reaching overhead; lying on Lt side(but not as bad as it did)    Pain Relieving Factors moving arm                [facility] PT Assessment - 05/10/22 0001       Assessment   Medical Diagnosis M25.512 (ICD-10-CM) - Pain in left shoulder  M25.561 (ICD-10-CM) -  Pain in right knee    Referring Provider (PT) Kristen Loader, FNP    Onset Date/Surgical Date 03/02/22    Hand Dominance Right      Observation/Other Assessments   Focus on Therapeutic Outcomes (FOTO)  61      AROM   Left Shoulder Extension 55 Degrees    Left Shoulder Flexion 132 Degrees    Left Shoulder ABduction 135 Degrees    Left Shoulder External Rotation 83 Degrees   shoulder 90 deg abd/elbow 90 deg flexion     Strength   Right/Left Shoulder --   increasing strength - no crepitus noted   Left Shoulder Flexion 4+/5    Left Shoulder Extension 5/5    Left Shoulder ABduction 5/5    Left Shoulder Internal Rotation 4+/5    Left Shoulder External Rotation 4+/5    Right Knee Flexion 4+/5    Right Knee Extension 5/5    Left Knee Flexion 4+/5    Left Knee Extension 5/5      Flexibility   Hamstrings  tight Rt > Lt    Piriformis tight Rt > Lt                           [facility] Adult PT Treatment/Exercise - 05/10/22 0001       Knee/Hip Exercises: Aerobic   Nustep L6 x 6.5 min U/LE's      Knee/Hip Exercises: Standing   Knee Flexion Limitations hip knee flexion tapping 12 in step x 20 each alternating feet    Lateral Step Up Right;Left;10 reps;Hand Hold: 1;Step Height: 6"    Forward Step Up Right;Left;10 reps;Hand Hold: 2;Step Height: 6"    Step Down Left;15 reps;Hand Hold: 2;Step Height: 6"    Step Down Limitations lowering wth Rt    Functional Squat 20 reps    Functional Squat Limitations UE support as needed    SLS 20 sec x 3 reps each side      Knee/Hip Exercises: Seated   Sit to Sand 10 reps;without UE support   slow eccentric lowering stand to sit     Shoulder Exercises: Standing   External Rotation Strengthening;Left;20 reps;Theraband    Theraband Level (Shoulder External Rotation) Level 2 (Red)    External Rotation Limitations reactive 10 sec hold x 3 reps    Internal Rotation Left;Strengthening;10 reps;Theraband    Theraband Level (Shoulder  Internal Rotation) Level 2 (Red)    Row Strengthening;Both;10 reps;Theraband    Theraband Level (Shoulder Row) Level 4 (Blue)    Row Limitations isometric step back - avoiding popping      Shoulder Exercises: ROM/Strengthening   Wall Pushups 10 reps    Wall Pushups Limitations reverse wall push up elbows bent x 10 reps    Modified Plank 3 reps;45 seconds    Modified Plank Limitations counter plank hands resting on pillow                       PT Short Term Goals - 03/30/22 1420       PT SHORT TERM GOAL #1   Title ind with initial HEP    Time 2    Period Weeks    Status New    Target Date 04/13/22               PT Long Term Goals - 05/10/22 1341       PT LONG TERM GOAL #1   Title Patient to demonstrate improved L shoulder active flexion and scaption to 150 deg or better to normalize ADLS.    Time 6    Period Weeks    Status On-going    Target Date 06/22/22      PT LONG TERM GOAL #2   Title Decreased pain with sleeping on left shoulder by 75% or more.    Time 6    Status On-going    Target Date 06/22/22      PT LONG TERM GOAL #3   Title Improved L shoulder strength to 5/5 to improve function.    Time 6    Period Weeks    Status Partially Met    Target Date 06/22/22      PT LONG TERM GOAL #4   Title Pt able to descend stairs and walk down declines with a normal gait pattern and decreased pain by 75% or more.    Time 6    Period Weeks    Status Partially Met    Target Date 06/22/22  PT LONG TERM GOAL #5   Title Improved B hip and knee strength to 5/5 to improve function.    Time 6    Period Weeks    Status Partially Met    Target Date 06/22/22      PT LONG TERM GOAL #6   Title improved shoulder FOTO to 67 or better demonstrating improved function    Time 6    Period Weeks    Status On-going    Target Date 06/22/22                   Plan - 05/10/22 1333     Clinical Impression Statement Shariq reports some improvement.  He has less pain in the Lt shoulder and Rt knee. He is performing more functional activities with less pain and limitation. ROM is ~ the same but patient demonstrates increased strength in U/LE's. Norvin has continued pain in the Lt shoulder with overhead reaching and pain in the Rt knee with steps. He is working toward stated goals of therapy and will benefit from continued treatment to achieve independent home management and accomplish goals of therapy.    Rehab Potential Good    PT Frequency 2x / week    PT Duration 6 weeks    PT Treatment/Interventions ADLs/Self Care Home Management;Cryotherapy;Electrical Stimulation;Iontophoresis 72m/ml Dexamethasone;Moist Heat;Neuromuscular re-education;Balance training;Therapeutic exercise;Therapeutic activities;Stair training;Gait training;Patient/family education;Manual techniques;Dry needling;Taping    PT Next Visit Plan Rt knee strength (eccentrics), postural strength; LE/UE stabilization    PT Home Exercise Plan [id number]    Consulted and Agree with Plan of Care Patient             Patient will benefit from skilled therapeutic intervention in order to improve the following deficits and impairments:     Visit Diagnosis: Chronic pain of right knee  Chronic left shoulder pain  Muscle weakness (generalized)  Stiffness of left shoulder, not elsewhere classified  Abnormal posture     Problem List There are no problems to display for this patient.   CEverardo All PT, MPH  05/10/2022, 2:02 PM  [facility] [address]Phone: [phone]  Fax:  [phone] Name: JNEERAJ HOUSANDMRN: [id number] of Birth: [birth year]

## 2022-05-20 ENCOUNTER — Encounter: Payer: Self-pay | Admitting: Rehabilitative and Restorative Service Providers"

## 2022-05-20 ENCOUNTER — Ambulatory Visit: Payer: Medicare Other | Admitting: Rehabilitative and Restorative Service Providers"

## 2022-05-20 DIAGNOSIS — R293 Abnormal posture: Secondary | ICD-10-CM | POA: Diagnosis not present

## 2022-05-20 DIAGNOSIS — M25612 Stiffness of left shoulder, not elsewhere classified: Secondary | ICD-10-CM | POA: Diagnosis not present

## 2022-05-20 DIAGNOSIS — G8929 Other chronic pain: Secondary | ICD-10-CM | POA: Diagnosis not present

## 2022-05-20 DIAGNOSIS — M6281 Muscle weakness (generalized): Secondary | ICD-10-CM

## 2022-05-20 DIAGNOSIS — M25561 Pain in right knee: Secondary | ICD-10-CM | POA: Diagnosis not present

## 2022-05-20 DIAGNOSIS — M25512 Pain in left shoulder: Secondary | ICD-10-CM | POA: Diagnosis not present

## 2022-05-20 NOTE — Patient Instructions (Addendum)
Access Code: [email or web address] URL: [email or web address] Date: 05/20/2022 Prepared by: Gillermo Murdoch  Exercises - Shoulder Flexion Wall Slide with Towel (Mirrored)  - 1 x daily - 7 x weekly - 1 sets - 10 reps - Seated Hamstring Stretch  - 1 x daily - 7 x weekly - 1 sets - 3 reps - 20-30 sec hold - Seated Figure 4 Piriformis Stretch  - 1 x daily - 7 x weekly - 1 sets - 3 reps - 20-30 sec hold - Wall Squat  - 1 x daily - 7 x weekly - 2 sets - 10 reps - 3-5 seconds hold - Side Stepping with Resistance at Ankles  - 1 x daily - 7 x weekly - 3 sets - 10 reps - Standing Bilateral Low Shoulder Row with Anchored Resistance  - 1 x daily - 7 x weekly - 3 sets - 10 reps - Shoulder extension with resistance - Neutral  - 1 x daily - 7 x weekly - 3 sets - 10 reps - Doorway Pec Stretch at 60 Degrees Abduction  - 3 x daily - 7 x weekly - 1 sets - 3 reps - Doorway Pec Stretch at 90 Degrees Abduction  - 3 x daily - 7 x weekly - 1 sets - 3 reps - 30 seconds  hold - Doorway Pec Stretch at 120 Degrees Abduction  - 3 x daily - 7 x weekly - 1 sets - 3 reps - 30 second hold  hold - Standing Scapular Retraction  - 3 x daily - 7 x weekly - 1 sets - 10 reps - 5-10 sec  hold - Forward Step Down  - 1 x daily - 7 x weekly - 1-2 sets - 10 reps - 2 sec  hold - Lateral Step Down  - 1 x daily - 7 x weekly - 1-2 sets - 10 reps - 2 sec  hold - Sit to Stand  - 2 x daily - 7 x weekly - 1 sets - 10 reps - 3-5 sec  hold - Single Leg Stance with Support  - 2 x daily - 7 x weekly - 1 sets - 3 reps - 30 sec  hold - Shoulder External Rotation Reactive Isometrics  - 1 x daily - 7 x weekly - 1 sets - 5-10 reps - 10-30 sec  hold - Shoulder Internal Rotation Reactive Isometrics  - 2 x daily - 7 x weekly - 1 sets - 10 reps - 3-5 sec  hold - Wall Push Up  - 1 x daily - 7 x weekly - 1-3 sets - 10 reps - 3 sec  hold - Single Leg Balance with Clock Reach  - 1 x daily - 7 x weekly - 1-2 sets - 10 reps

## 2022-05-20 NOTE — Therapy (Signed)
[place], [place], [address] Phone: [phone]   Fax:  [phone]  Physical Therapy Treatment Rationale for Evaluation and Treatment Rehabilitation  PHYSICAL THERAPY DISCHARGE SUMMARY  Visits from Start of Care: 8  Current functional level related to goals / functional outcomes: See evaluation    Remaining deficits: Intermittent pain    Education / Equipment: Monroe    Patient agrees to discharge. Patient goals were partially met. Patient is being discharged due to being pleased with the current functional level.   Patient Details  Name: Phillip Monroe MRN: [id number] Date of Birth: 1935-01-12 Referring Provider (PT): Kristen Loader, FNP   Encounter Date: 05/20/2022   PT End of Session - 05/20/22 1410     Visit Number 7    Number of Visits 18    Date for PT Re-Evaluation 06/22/22    Authorization Type UHC MCR    Authorization - Visit Number 7    Progress Note Due on Visit 10    PT Start Time 5397    PT Stop Time 1446    PT Time Calculation (min) 42 min    Activity Tolerance Patient tolerated treatment well             Past Medical History:  Diagnosis Date   Actinic keratoses    Bladder cancer ([place])    Hemorrhoids    Hypertension     Past Surgical History:  Procedure Laterality Date   APPENDECTOMY     COLONOSCOPY     INGUINAL HERNIA REPAIR      There were no vitals filed for this visit.   Subjective Assessment - 05/20/22 1410     Subjective Phillip Monroe reports that he is doing his exercises at home some and has been working in his garden. Phillip Monroe shoulder is popping less still pops when he gets in a strain or lifts something heavy. Rt knee with certain positions especially if he has weight on the Rt leg and twists.    Currently in Pain? No/denies    Pain Score 0-No pain    Pain Location Knee    Pain Type Chronic pain    Pain Onset More than a month ago    Pain Frequency  Intermittent    Aggravating Factors  steps    Pain Relieving Factors resolves quickly    Multiple Pain Sites No    Pain Score 0    Pain Location Shoulder    Pain Orientation Left    Pain Descriptors / Indicators Sore    Pain Type Chronic pain    Pain Onset More than a month ago    Pain Frequency Intermittent    Aggravating Factors  reaching overhead; lying on Phillip Monroe side for prolonged periods of time    Pain Relieving Factors moving arm                [facility] PT Assessment - 05/20/22 0001       Assessment   Medical Diagnosis M25.512 (ICD-10-CM) - Pain in left shoulder  M25.561 (ICD-10-CM) - Pain in right knee    Referring Provider (PT) Kristen Loader, FNP    Onset Date/Surgical Date 03/02/22    Hand Dominance Right      Observation/Other Assessments   Focus on Therapeutic Outcomes (FOTO)  70      AROM   Left Shoulder Extension 55 Degrees    Left Shoulder Flexion 132 Degrees    Left Shoulder ABduction 135  Degrees    Left Shoulder External Rotation 83 Degrees   shoulder 90 deg abd/elbow 90 deg flexion     Strength   Left Shoulder Flexion 4+/5    Left Shoulder Extension 5/5    Left Shoulder ABduction 5/5    Left Shoulder Internal Rotation 4+/5    Left Shoulder External Rotation 4+/5    Right Knee Flexion 5/5    Right Knee Extension 5/5    Left Knee Flexion 5/5    Left Knee Extension 5/5                           [facility] Adult PT Treatment/Exercise - 05/20/22 0001       Knee/Hip Exercises: Stretches   Passive Hamstring Stretch Right;2 reps;30 seconds    Passive Hamstring Stretch Limitations sitting      Knee/Hip Exercises: Aerobic   Nustep L6 x 10 min U/LE's      Knee/Hip Exercises: Standing   Knee Flexion Limitations hip knee flexion tapping 12 in step x 20 each alternating feet    Lateral Step Up Right;Left;10 reps;Hand Hold: 1;Step Height: 6"    Forward Step Up Right;Left;10 reps;Hand Hold: 2;Step Height: 6"    Step Down Left;15 reps;Hand  Hold: 2;Step Height: 6"    Step Down Limitations lowering wth Rt    Functional Squat 20 reps    Functional Squat Limitations UE support as needed    Wall Squat 10 reps;10 seconds    SLS 20 sec x 3 reps each side      Shoulder Exercises: ROM/Strengthening   Wall Pushups 10 reps    Wall Pushups Limitations reverse wall push up elbows bent x 10 reps    Modified Plank 3 reps;45 seconds    Modified Plank Limitations counter plank hands resting on pillow      Shoulder Exercises: Stretch   Other Shoulder Stretches doorway stretch 3 positions 2 reps x 30 sec - cues for technique                     PT Education - 05/20/22 1435     Education Details Monroe review    Person(s) Educated Patient    Methods Explanation;Demonstration;Tactile cues;Verbal cues   has handouts   Comprehension Verbalized understanding;Returned demonstration;Verbal cues required;Tactile cues required              PT Short Term Goals - 05/20/22 1420       PT SHORT TERM GOAL #1   Title ind with initial Monroe    Time 2    Period Weeks    Status Achieved    Target Date 04/13/22               PT Long Term Goals - 05/20/22 1420       PT LONG TERM GOAL #1   Title Patient to demonstrate improved L shoulder active flexion and scaption to 150 deg or better to normalize ADLS.    Time 6    Period Weeks    Status Partially Met    Target Date 06/22/22      PT LONG TERM GOAL #2   Title Decreased pain with sleeping on left shoulder by 75% or more.    Time 6    Period Weeks    Status Achieved    Target Date 06/22/22      PT LONG TERM GOAL #3   Title Improved L shoulder strength to  5/5 to improve function.    Time 6    Period Weeks    Status Achieved    Target Date 06/22/22      PT LONG TERM GOAL #4   Title Pt able to descend stairs and walk down declines with a normal gait pattern and decreased pain by 75% or more.    Time 6    Status Achieved    Target Date 06/22/22      PT LONG TERM  GOAL #5   Title Improved B hip and knee strength to 5/5 to improve function.    Time 6    Period Weeks    Status Partially Met    Target Date 06/22/22      PT LONG TERM GOAL #6   Title improved shoulder FOTO to 67 or better demonstrating improved function    Time 6    Period Weeks    Status Achieved    Target Date 06/22/22                   Plan - 05/20/22 1414     Clinical Impression Statement Phillip Monroe demonstrates improved mobilty and strength in Phillip Monroe shoulder and Rt knee. He has less pain and is working on exercises at home. Phillip Monroe. Goals of therapy have ben partially accomplished and Phillip Monroe. He will call with any questions or problems    Rehab Potential Good    PT Frequency 2x / week    PT Duration 6 weeks    PT Treatment/Interventions ADLs/Self Care Home Management;Cryotherapy;Electrical Stimulation;Iontophoresis 51m/ml Dexamethasone;Moist Heat;Neuromuscular re-education;Balance training;Therapeutic exercise;Therapeutic activities;Stair training;Gait training;Patient/family education;Manual techniques;Dry needling;Taping    PT Next Visit Plan D/C to independent Monroe    PT Home Exercise Plan [id number]    Consulted and Agree with Plan of Care Patient             Patient will benefit from skilled therapeutic intervention in order to improve the following deficits and impairments:     Visit Diagnosis: Chronic pain of right knee  Chronic left shoulder pain  Muscle weakness (generalized)  Stiffness of left shoulder, not elsewhere classified  Abnormal posture     Problem List There are no problems to display for this patient.   Phillip Monroe Phillip Monroe PT, MPH  05/20/2022, 2:48 PM  [facility] [address]Phone: [phone]  Fax:  [phone] Name: Phillip Monroe:  [id number] of Birth: [birth year]

## 2022-07-17 ENCOUNTER — Emergency Department (HOSPITAL_BASED_OUTPATIENT_CLINIC_OR_DEPARTMENT_OTHER)
Admission: EM | Admit: 2022-07-17 | Discharge: 2022-07-17 | Disposition: A | Discharge status: Home / Self Care | Payer: Medicare Other | Attending: Emergency Medicine | Admitting: Emergency Medicine

## 2022-07-17 ENCOUNTER — Emergency Department (HOSPITAL_BASED_OUTPATIENT_CLINIC_OR_DEPARTMENT_OTHER): Payer: Medicare Other

## 2022-07-17 ENCOUNTER — Encounter (HOSPITAL_BASED_OUTPATIENT_CLINIC_OR_DEPARTMENT_OTHER): Payer: Self-pay | Admitting: Emergency Medicine

## 2022-07-17 ENCOUNTER — Other Ambulatory Visit: Payer: Self-pay

## 2022-07-17 DIAGNOSIS — Z8551 Personal history of malignant neoplasm of bladder: Secondary | ICD-10-CM | POA: Diagnosis not present

## 2022-07-17 DIAGNOSIS — N281 Cyst of kidney, acquired: Secondary | ICD-10-CM | POA: Diagnosis not present

## 2022-07-17 DIAGNOSIS — R109 Unspecified abdominal pain: Secondary | ICD-10-CM | POA: Insufficient documentation

## 2022-07-17 DIAGNOSIS — K449 Diaphragmatic hernia without obstruction or gangrene: Secondary | ICD-10-CM | POA: Diagnosis not present

## 2022-07-17 LAB — URINALYSIS, ROUTINE W REFLEX MICROSCOPIC
Bilirubin Urine: NEGATIVE
Glucose, UA: NEGATIVE mg/dL
Hgb urine dipstick: NEGATIVE
Ketones, ur: NEGATIVE mg/dL
Leukocytes,Ua: NEGATIVE
Nitrite: NEGATIVE
Protein, ur: NEGATIVE mg/dL
Specific Gravity, Urine: 1.02 (ref 1.005–1.030)
pH: 5.5 (ref 5.0–8.0)

## 2022-07-17 MED ORDER — DICLOFENAC SODIUM 1 % EX GEL: 4.0000 g | Freq: Four times a day (QID) | CUTANEOUS | 0 refills | Status: AC

## 2022-07-17 NOTE — Discharge Instructions (Signed)
Your back pain is most likely due to a muscular strain.  There is been a lot of research on back pain, unfortunately the only thing that seems to really help is Tylenol and ibuprofen.  Relative rest is also important to not lift greater than 10 pounds bending or twisting at the waist.  Please follow-up with your family physician.  The other thing that really seems to benefit patients is physical therapy which your doctor may send you for.  Please return to the emergency department for new numbness or weakness to your arms or legs. Difficulty with urinating or urinating or pooping on yourself.  Also if you cannot feel toilet paper when you wipe or get a fever.   Use the gel as prescribed Also take tylenol 1000mg(2 extra strength) four times a day.   

## 2022-07-17 NOTE — ED Provider Notes (Signed)
[facility] EMERGENCY DEPARTMENT Provider Note   CSN: [id number] Arrival date & time: 07/17/22  1738     History  Chief Complaint  Patient presents with   Flank Pain    Phillip Monroe is a 87 y.o. male.  87 yo M with a chief complaints of left flank pain.  This been going on for a couple days now.  Radiating down into the left groin.  No rash no trauma.  Seems to be worse with prolonged standing.  He denies any urinary symptoms.  Denies hematuria.  He has a history of kidney stones and thinks this feels somewhat similar.  Reportedly was having some difficulty sleeping last night.   Flank Pain       Home Medications Prior to Admission medications   Medication Sig Start Date End Date Taking? Authorizing Provider  diclofenac Sodium (VOLTAREN) 1 % GEL Apply 4 g topically 4 (four) times daily. 07/17/22  Yes Deno Etienne, DO  atenolol (TENORMIN) 100 MG tablet Take 100 mg by mouth daily.    [provider]  benzonatate (TESSALON) 200 MG capsule Take 1 capsule (200 mg total) by mouth 3 (three) times daily as needed for cough. 04/07/21   Rodriguez-Southworth, Sunday Spillers, PA-C  Fish Oil-Cholecalciferol (FISH OIL + D3) 1000-1000 MG-UNIT CAPS Take by mouth.    [provider]  hydrocortisone (ANUSOL-HC) 2.5 % rectal cream Place 1 application rectally 2 (two) times daily.    [provider]  Molnupiravir 200 MG CAPS Take 4 capsules (800 mg total) by mouth 2 (two) times daily. 04/09/21   Rodriguez-Southworth, Sunday Spillers, PA-C  omeprazole (PRILOSEC) 20 MG capsule Take 20 mg by mouth daily.    [provider]  tamsulosin (FLOMAX) 0.4 MG CAPS capsule Take 0.4 mg by mouth.    [provider]  vitamin E 100 UNIT capsule Take by mouth daily.    [provider]      Allergies    Nsaids    Review of Systems   Review of Systems  Genitourinary:  Positive for flank pain.    Physical Exam Updated Vital Signs BP (!) 163/73 (BP Location: Right  Arm)   Pulse (!) 57   Temp 97.9 F (36.6 C) (Oral)   Resp 16   Ht '5\' 6"'$  (1.676 m)   Wt 60.3 kg   SpO2 100%   BMI 21.46 kg/m  Physical Exam Vitals and nursing note reviewed.  Constitutional:      Appearance: He is well-developed.  HENT:     Head: Normocephalic and atraumatic.  Eyes:     Pupils: Pupils are equal, round, and reactive to light.  Neck:     Vascular: No JVD.  Cardiovascular:     Rate and Rhythm: Normal rate and regular rhythm.     Heart sounds: No murmur heard.    No friction rub. No gallop.  Pulmonary:     Effort: No respiratory distress.     Breath sounds: No wheezing.  Abdominal:     General: There is no distension.     Tenderness: There is no abdominal tenderness. There is no guarding or rebound.  Musculoskeletal:        General: Normal range of motion.     Cervical back: Normal range of motion and neck supple.     Comments: Pulse motor and sensation intact to the left lower extremity.  Reflexes are 2+ and equal.  No clonus.  Negative straight leg raise test.  Skin:  Coloration: Skin is not pale.     Findings: No rash.  Neurological:     Mental Status: He is alert and oriented to person, place, and time.  Psychiatric:        Behavior: Behavior normal.     ED Results / Procedures / Treatments   Labs (all labs ordered are listed, but only abnormal results are displayed) Labs Reviewed  URINALYSIS, ROUTINE W REFLEX MICROSCOPIC    EKG None  Radiology CT Renal Stone Study  Result Date: 07/17/2022 CLINICAL DATA:  Left flank pain.  History of bladder cancer. EXAM: CT ABDOMEN AND PELVIS WITHOUT CONTRAST TECHNIQUE: Multidetector CT imaging of the abdomen and pelvis was performed following the standard protocol without IV contrast. RADIATION DOSE REDUCTION: This exam was performed according to the departmental dose-optimization program which includes automated exposure control, adjustment of the mA and/or kV according to patient size and/or use of  iterative reconstruction technique. COMPARISON:  MRI abdomen 09/10/2019. CT abdomen and pelvis 08/28/2019. FINDINGS: Lower chest: No acute abnormality. Hepatobiliary: Subcentimeter rounded hypodensity in the inferior right lobe of the liver is unchanged from 2020 favored as a small cyst or hemangioma. Otherwise, liver, gallbladder and bile ducts are within normal limits. No gallstones, gallbladder wall thickening, or biliary dilatation. Pancreas: Unremarkable. No pancreatic ductal dilatation or surrounding inflammatory changes. Spleen: Normal in size without focal abnormality. Adrenals/Urinary Tract: Simple left renal cyst posteriorly measuring 12 mm. Mildly hyperdense lesion in the inferior pole the left kidney measures 3 cm. This is characterized as a complex cyst on prior MRI. No urinary tract calculi or hydronephrosis. Adrenal glands and bladder are within normal limits. Stomach/Bowel: No evidence of bowel wall thickening, distention, or inflammatory changes. The appendix is not visualized. Large hiatal hernia has increased in size. Vascular/Lymphatic: Aortic atherosclerosis. No enlarged abdominal or pelvic lymph nodes. Reproductive: Prostate gland is enlarged, unchanged. Other: No abdominal wall hernia or abnormality. No abdominopelvic ascites. Musculoskeletal: Multilevel degenerative changes affect the spine. IMPRESSION: 1. No acute localizing process in the abdomen or pelvis. 2. Large hiatal hernia has increased in size. 3. Prostatomegaly. 4.  Aortic Atherosclerosis (ICD10-I70.0). 5. Grossly unchanged complex left renal cystic lesion. No follow-up imaging recommended. Electronically Signed   By: Ronney Asters M.D.   On: 07/17/2022 18:22    Procedures Procedures    Medications Ordered in ED Medications - No data to display  ED Course/ Medical Decision Making/ A&P                           Medical Decision Making Amount and/or Complexity of Data Reviewed Labs: ordered. Radiology: ordered.   80  yoM with a remote history of bladder cancer comes in with a chief complaints of left flank pain.  He thinks it feels like a kidney stone he had in the past.  No hematuria or signs of infection on his UA.  The patient pain seems to be worse with prolonged standing.  Seems more likely to be sciatica.  He does have a history of back pain in the past as well.  CT stone study performed due to history of cancer without any obvious concerning pathology.  I discussed results with the patient.  Trial of Tylenol and diclofenac gel.  PCP follow-up.  6:35 PM:  I have discussed the diagnosis/risks/treatment options with the patient.  Evaluation and diagnostic testing in the emergency department does not suggest an emergent condition requiring admission or immediate intervention beyond what has been  performed at this time.  They will follow up with  PCP. We also discussed returning to the ED immediately if new or worsening sx occur. We discussed the sx which are most concerning (e.g., sudden worsening pain, fever, inability to tolerate by mouth) that necessitate immediate return. Medications administered to the patient during their visit and any new prescriptions provided to the patient are listed below.  Medications given during this visit Medications - No data to display   The patient appears reasonably screen and/or stabilized for discharge and I doubt any other medical condition or other [facility] requiring further screening, evaluation, or treatment in the ED at this time prior to discharge.          Final Clinical Impression(s) / ED Diagnoses Final diagnoses:  Left flank pain    Rx / DC Orders ED Discharge Orders          Ordered    diclofenac Sodium (VOLTAREN) 1 % GEL  4 times daily        07/17/22 1826              Deno Etienne, DO 07/17/22 1835

## 2022-07-17 NOTE — ED Triage Notes (Signed)
Left flank and back pain x 3 days  hx of bladder cancer

## 2022-08-23 DIAGNOSIS — K219 Gastro-esophageal reflux disease without esophagitis: Secondary | ICD-10-CM | POA: Diagnosis not present

## 2022-08-23 DIAGNOSIS — Z136 Encounter for screening for cardiovascular disorders: Secondary | ICD-10-CM | POA: Diagnosis not present

## 2022-08-23 DIAGNOSIS — Z23 Encounter for immunization: Secondary | ICD-10-CM | POA: Diagnosis not present

## 2022-08-23 DIAGNOSIS — I10 Essential (primary) hypertension: Secondary | ICD-10-CM | POA: Diagnosis not present

## 2022-08-23 DIAGNOSIS — Z0000 Encounter for general adult medical examination without abnormal findings: Secondary | ICD-10-CM | POA: Diagnosis not present

## 2022-08-23 DIAGNOSIS — Z13220 Encounter for screening for lipoid disorders: Secondary | ICD-10-CM | POA: Diagnosis not present

## 2022-08-23 DIAGNOSIS — D509 Iron deficiency anemia, unspecified: Secondary | ICD-10-CM | POA: Diagnosis not present

## 2022-09-22 DIAGNOSIS — M1711 Unilateral primary osteoarthritis, right knee: Secondary | ICD-10-CM | POA: Diagnosis not present

## 2022-10-29 ENCOUNTER — Ambulatory Visit
Admission: EM | Admit: 2022-10-29 | Discharge: 2022-10-29 | Disposition: A | Discharge status: Home / Self Care | Payer: Medicare Other | Attending: Family Medicine | Admitting: Family Medicine

## 2022-10-29 ENCOUNTER — Encounter: Payer: Self-pay | Admitting: Emergency Medicine

## 2022-10-29 ENCOUNTER — Ambulatory Visit (INDEPENDENT_AMBULATORY_CARE_PROVIDER_SITE_OTHER): Payer: Medicare Other

## 2022-10-29 DIAGNOSIS — K449 Diaphragmatic hernia without obstruction or gangrene: Secondary | ICD-10-CM | POA: Diagnosis not present

## 2022-10-29 DIAGNOSIS — R053 Chronic cough: Secondary | ICD-10-CM | POA: Diagnosis not present

## 2022-10-29 DIAGNOSIS — R059 Cough, unspecified: Secondary | ICD-10-CM | POA: Diagnosis not present

## 2022-10-29 MED ORDER — DOXYCYCLINE HYCLATE 100 MG PO CAPS: ORAL_CAPSULE | ORAL | 0 refills | Status: AC

## 2022-10-29 NOTE — ED Triage Notes (Signed)
Cough and congestion x 3 weeks  No OTC meds  Started w/ a runny nose 3 weeks ago Feels like it has settled in his chest Denies fever or chills

## 2022-10-29 NOTE — ED Provider Notes (Signed)
Phillip Monroe [facility]    CSN: [id number] Arrival date & time: 10/29/22  1303      History   Chief Complaint Chief Complaint  Patient presents with   Cough    HPI Phillip Monroe is a 88 y.o. male.   Patient reports that he developed URI symptoms about 3 weeks ago.  His initial sore throat resolved but he continues to have mild nasal congestion and persistent cough.  He feels like it has "settled in my chest."  He denies pleuritic pain and shortness of breath, but his sputum became yellow about a week ago.  The history is provided by the patient.    Past Medical History:  Diagnosis Date   Actinic keratoses    Bladder cancer ([place])    Hemorrhoids    Hypertension     There are no problems to display for this patient.   Past Surgical History:  Procedure Laterality Date   APPENDECTOMY     COLONOSCOPY     INGUINAL HERNIA REPAIR         Home Medications    Prior to Admission medications   Medication Sig Start Date End Date Taking? Authorizing Provider  doxycycline (VIBRAMYCIN) 100 MG capsule Take one cap PO Q12hr with food. 10/29/22  Yes Kandra Nicolas, MD  Fish Oil-Cholecalciferol (FISH OIL + D3) 1000-1000 MG-UNIT CAPS Take by mouth.   Yes [provider]  vitamin E 100 UNIT capsule Take by mouth daily.   Yes [provider]  atenolol (TENORMIN) 100 MG tablet Take 100 mg by mouth daily.    [provider]  benzonatate (TESSALON) 200 MG capsule Take 1 capsule (200 mg total) by mouth 3 (three) times daily as needed for cough. Patient not taking: Reported on 10/29/2022 04/07/21   Rodriguez-Southworth, Sunday Spillers, PA-C  diclofenac Sodium (VOLTAREN) 1 % GEL Apply 4 g topically 4 (four) times daily. Patient not taking: Reported on 10/29/2022 07/17/22   Deno Etienne, DO  hydrocortisone (ANUSOL-HC) 2.5 % rectal cream Place 1 application rectally 2 (two) times daily. Patient not taking: Reported on 10/29/2022    [provider]  Molnupiravir 200 MG CAPS  Take 4 capsules (800 mg total) by mouth 2 (two) times daily. Patient not taking: Reported on 10/29/2022 04/09/21   Rodriguez-Southworth, Sunday Spillers, PA-C  omeprazole (PRILOSEC) 20 MG capsule Take 20 mg by mouth daily.    [provider]  tamsulosin (FLOMAX) 0.4 MG CAPS capsule Take 0.4 mg by mouth.    [provider]    Family History Family History  Problem Relation Age of Onset   Stroke Mother    Heart attack Father     Social History Social History   Tobacco Use   Smoking status: Former   Smokeless tobacco: Never  Scientific laboratory technician Use: Never used  Substance Use Topics   Alcohol use: No   Drug use: No     Allergies   Nsaids   Review of Systems Review of Systems No sore throat + cough No pleuritic pain No wheezing + nasal congestion + post-nasal drainage No sinus pain/pressure No itchy/red eyes No earache No hemoptysis No SOB No fever/chills No nausea No vomiting No abdominal pain No diarrhea No urinary symptoms No skin rash No fatigue No myalgias No headache   Physical Exam Triage Vital Signs ED Triage Vitals [10/29/22 1501]  Enc Vitals Group     BP (!) 190/82     Pulse Rate 62  Resp 18     Temp 98.8 F (37.1 C)     Temp Source Oral     SpO2 98 %     Weight 151 lb (68.5 kg)     Height '5\' 6"'$  (1.676 m)     Head Circumference      Peak Flow      Pain Score 0     Pain Loc      Pain Edu?      Excl. in [place]?    No data found.  Updated Vital Signs BP (!) 190/82 (BP Location: Left Arm) Comment: hx of HTN - on meds  Pulse 62   Temp 98.8 F (37.1 C) (Oral)   Resp 18   Ht '5\' 6"'$  (1.676 m)   Wt 68.5 kg   SpO2 98%   BMI 24.37 kg/m   Visual Acuity Right Eye Distance:   Left Eye Distance:   Bilateral Distance:    Right Eye Near:   Left Eye Near:    Bilateral Near:     Physical Exam Nursing notes and Vital Signs reviewed. Appearance:  Patient appears stated age, and in no acute distress Eyes:  Pupils are equal,  round, and reactive to light and accomodation.  Extraocular movement is intact.  Conjunctivae are not inflamed  Ears:  Canals normal.  Tympanic membranes normal.  Nose:  Normal turbinates.  No sinus tenderness.  Neck:  Supple.  No adenopathy.  Lungs: Few wheezes/rhonchi right base.  Breath sounds are equal.  Moving air well. Heart:  Regular rate and rhythm without murmurs, rubs, or gallops.  Abdomen:  Nontender without masses or hepatosplenomegaly.  Bowel sounds are present.  No CVA or flank tenderness.  Extremities:  No edema.  Skin:  No rash present.   UC Treatments / Results  Labs (all labs ordered are listed, but only abnormal results are displayed) Labs Reviewed - No data to display  EKG   Radiology DG Chest 2 View  Result Date: 10/29/2022 CLINICAL DATA:  Cough for 3 weeks.  History of bladder cancer. EXAM: CHEST - 2 VIEW COMPARISON:  Chest x-ray 03/26/2015 and older.  Chest CT 08/28/2019 FINDINGS: Large hiatal hernia identified with double density with air overlying the lower mediastinum. Calcified tortuous aorta. Otherwise normal cardiopericardial silhouette. No pneumothorax or effusion. No edema. Slight asymmetric area of density along the right lung base above the diaphragm may be sequela of overlapping structures but would recommend follow-up CT scan or short follow-up x-ray in light of the patient's history to confirm. IMPRESSION: Large hiatal hernia. ng artifacts structures however with the patient's clinical history would recommend either follow-up x-ray or cross-sectional imaging when appropriate to confirm Electronically Signed   By: Jill Side M.D.   On: 10/29/2022 15:23   Asymmetric area of density at the lung the right lung base could represent overlappi Procedures Procedures (including critical care time)  Medications Ordered in UC Medications - No data to display  Initial Impression / Assessment and Plan / UC Course  I have reviewed the triage vital signs and the  nursing notes.  Pertinent labs & imaging results that were available during my care of the patient were reviewed by me and considered in my medical decision making (see chart for details).    Note chest x-ray finding of "Slight asymmetric area of density along the right lung base above the diaphragm." Suspect right lower lobe pneumonia.  Begin doxycycline. Followup with PCP in 10 days for repeat chest x-ra;y.  Final Clinical Impressions(s) / UC Diagnoses   Final diagnoses:  Persistent cough for 3 weeks or longer     Discharge Instructions      Take plain guaifenesin ('1200mg'$  extended release tabs such as Mucinex) twice daily, with plenty of water, for cough and congestion.  Get adequate rest.   May take Delsym Cough Suppressant ("12 Hour Cough Relief") at bedtime for nighttime cough.    If symptoms become significantly worse during the night or over the weekend, proceed to the local emergency room.     ED Prescriptions     Medication Sig Dispense Auth. Provider   doxycycline (VIBRAMYCIN) 100 MG capsule Take one cap PO Q12hr with food. 14 capsule Kandra Nicolas, MD         Kandra Nicolas, MD 10/30/22 2219

## 2022-10-29 NOTE — Discharge Instructions (Signed)
Take plain guaifenesin ('1200mg'$  extended release tabs such as Mucinex) twice daily, with plenty of water, for cough and congestion.  Get adequate rest.   May take Delsym Cough Suppressant ("12 Hour Cough Relief") at bedtime for nighttime cough.    If symptoms become significantly worse during the night or over the weekend, proceed to the local emergency room.

## 2022-10-29 NOTE — ED Notes (Signed)
Pt chart reviewed w/ Dr Assunta Found - based on age and time length of symptoms - RBVO for CXR

## 2022-11-09 ENCOUNTER — Ambulatory Visit
Admission: RE | Admit: 2022-11-09 | Discharge: 2022-11-09 | Disposition: A | Discharge status: Home / Self Care | Payer: Medicare Other | Source: Ambulatory Visit | Attending: Family Medicine | Admitting: Family Medicine

## 2022-11-09 ENCOUNTER — Other Ambulatory Visit: Payer: Self-pay | Admitting: Family Medicine

## 2022-11-09 DIAGNOSIS — K449 Diaphragmatic hernia without obstruction or gangrene: Secondary | ICD-10-CM | POA: Diagnosis not present

## 2022-11-09 DIAGNOSIS — J189 Pneumonia, unspecified organism: Secondary | ICD-10-CM

## 2022-11-09 DIAGNOSIS — I700 Atherosclerosis of aorta: Secondary | ICD-10-CM | POA: Diagnosis not present

## 2023-02-28 DIAGNOSIS — H2511 Age-related nuclear cataract, right eye: Secondary | ICD-10-CM | POA: Diagnosis not present

## 2023-02-28 DIAGNOSIS — H2513 Age-related nuclear cataract, bilateral: Secondary | ICD-10-CM | POA: Diagnosis not present

## 2023-02-28 DIAGNOSIS — H25043 Posterior subcapsular polar age-related cataract, bilateral: Secondary | ICD-10-CM | POA: Diagnosis not present

## 2023-02-28 DIAGNOSIS — H02831 Dermatochalasis of right upper eyelid: Secondary | ICD-10-CM | POA: Diagnosis not present

## 2023-02-28 DIAGNOSIS — H02834 Dermatochalasis of left upper eyelid: Secondary | ICD-10-CM | POA: Diagnosis not present

## 2023-03-01 DIAGNOSIS — Z0000 Encounter for general adult medical examination without abnormal findings: Secondary | ICD-10-CM | POA: Diagnosis not present

## 2023-03-01 DIAGNOSIS — I10 Essential (primary) hypertension: Secondary | ICD-10-CM | POA: Diagnosis not present

## 2023-05-17 DIAGNOSIS — H2511 Age-related nuclear cataract, right eye: Secondary | ICD-10-CM | POA: Diagnosis not present

## 2023-05-17 DIAGNOSIS — H25042 Posterior subcapsular polar age-related cataract, left eye: Secondary | ICD-10-CM | POA: Diagnosis not present

## 2023-05-17 DIAGNOSIS — H2512 Age-related nuclear cataract, left eye: Secondary | ICD-10-CM | POA: Diagnosis not present

## 2023-05-17 DIAGNOSIS — H269 Unspecified cataract: Secondary | ICD-10-CM | POA: Diagnosis not present

## 2023-05-24 DIAGNOSIS — H269 Unspecified cataract: Secondary | ICD-10-CM | POA: Diagnosis not present

## 2023-05-24 DIAGNOSIS — H2512 Age-related nuclear cataract, left eye: Secondary | ICD-10-CM | POA: Diagnosis not present

## 2023-05-30 DIAGNOSIS — L820 Inflamed seborrheic keratosis: Secondary | ICD-10-CM | POA: Diagnosis not present

## 2023-08-16 DIAGNOSIS — D485 Neoplasm of uncertain behavior of skin: Secondary | ICD-10-CM | POA: Diagnosis not present

## 2023-08-16 DIAGNOSIS — L821 Other seborrheic keratosis: Secondary | ICD-10-CM | POA: Diagnosis not present

## 2023-08-16 DIAGNOSIS — L814 Other melanin hyperpigmentation: Secondary | ICD-10-CM | POA: Diagnosis not present

## 2023-08-16 DIAGNOSIS — R229 Localized swelling, mass and lump, unspecified: Secondary | ICD-10-CM | POA: Diagnosis not present

## 2023-08-16 DIAGNOSIS — Z08 Encounter for follow-up examination after completed treatment for malignant neoplasm: Secondary | ICD-10-CM | POA: Diagnosis not present

## 2023-08-16 DIAGNOSIS — D0421 Carcinoma in situ of skin of right ear and external auricular canal: Secondary | ICD-10-CM | POA: Diagnosis not present

## 2023-08-16 DIAGNOSIS — Z85828 Personal history of other malignant neoplasm of skin: Secondary | ICD-10-CM | POA: Diagnosis not present

## 2023-08-16 DIAGNOSIS — C44529 Squamous cell carcinoma of skin of other part of trunk: Secondary | ICD-10-CM | POA: Diagnosis not present

## 2023-08-30 DIAGNOSIS — D509 Iron deficiency anemia, unspecified: Secondary | ICD-10-CM | POA: Diagnosis not present

## 2023-08-30 DIAGNOSIS — I10 Essential (primary) hypertension: Secondary | ICD-10-CM | POA: Diagnosis not present

## 2023-08-30 DIAGNOSIS — Z23 Encounter for immunization: Secondary | ICD-10-CM | POA: Diagnosis not present

## 2023-08-30 DIAGNOSIS — K7689 Other specified diseases of liver: Secondary | ICD-10-CM | POA: Diagnosis not present

## 2023-08-30 DIAGNOSIS — Z0000 Encounter for general adult medical examination without abnormal findings: Secondary | ICD-10-CM | POA: Diagnosis not present

## 2023-08-30 DIAGNOSIS — E786 Lipoprotein deficiency: Secondary | ICD-10-CM | POA: Diagnosis not present

## 2023-08-30 DIAGNOSIS — K219 Gastro-esophageal reflux disease without esophagitis: Secondary | ICD-10-CM | POA: Diagnosis not present

## 2023-08-30 DIAGNOSIS — Z8551 Personal history of malignant neoplasm of bladder: Secondary | ICD-10-CM | POA: Diagnosis not present

## 2023-09-09 DIAGNOSIS — Z8551 Personal history of malignant neoplasm of bladder: Secondary | ICD-10-CM | POA: Diagnosis not present

## 2023-09-12 DIAGNOSIS — D0421 Carcinoma in situ of skin of right ear and external auricular canal: Secondary | ICD-10-CM | POA: Diagnosis not present

## 2023-10-27 DIAGNOSIS — Z8551 Personal history of malignant neoplasm of bladder: Secondary | ICD-10-CM | POA: Diagnosis not present

## 2024-02-29 DIAGNOSIS — K219 Gastro-esophageal reflux disease without esophagitis: Secondary | ICD-10-CM | POA: Diagnosis not present

## 2024-02-29 DIAGNOSIS — Z6824 Body mass index (BMI) 24.0-24.9, adult: Secondary | ICD-10-CM | POA: Diagnosis not present

## 2024-02-29 DIAGNOSIS — E782 Mixed hyperlipidemia: Secondary | ICD-10-CM | POA: Diagnosis not present

## 2024-02-29 DIAGNOSIS — I10 Essential (primary) hypertension: Secondary | ICD-10-CM | POA: Diagnosis not present

## 2024-07-10 DIAGNOSIS — L2989 Other pruritus: Secondary | ICD-10-CM | POA: Diagnosis not present

## 2024-07-10 DIAGNOSIS — L814 Other melanin hyperpigmentation: Secondary | ICD-10-CM | POA: Diagnosis not present

## 2024-07-10 DIAGNOSIS — L821 Other seborrheic keratosis: Secondary | ICD-10-CM | POA: Diagnosis not present

## 2024-07-10 DIAGNOSIS — Z08 Encounter for follow-up examination after completed treatment for malignant neoplasm: Secondary | ICD-10-CM | POA: Diagnosis not present

## 2024-07-10 DIAGNOSIS — L820 Inflamed seborrheic keratosis: Secondary | ICD-10-CM | POA: Diagnosis not present

## 2024-07-10 DIAGNOSIS — Z789 Other specified health status: Secondary | ICD-10-CM | POA: Diagnosis not present

## 2024-07-10 DIAGNOSIS — Z85828 Personal history of other malignant neoplasm of skin: Secondary | ICD-10-CM | POA: Diagnosis not present

## 2024-07-10 DIAGNOSIS — L538 Other specified erythematous conditions: Secondary | ICD-10-CM | POA: Diagnosis not present
